# Patient Record
Sex: Female | Born: 1950 | Race: White | Hispanic: No | Smoking: Never smoker
Health system: Southern US, Community
[De-identification: ages and names within clinical notes are randomized; demographics above are authoritative.]

## PROBLEM LIST (undated history)

## (undated) DIAGNOSIS — R7303 Prediabetes: Secondary | ICD-10-CM

## (undated) DIAGNOSIS — G5601 Carpal tunnel syndrome, right upper limb: Secondary | ICD-10-CM

## (undated) DIAGNOSIS — R52 Pain, unspecified: Secondary | ICD-10-CM

## (undated) DIAGNOSIS — M545 Low back pain, unspecified: Secondary | ICD-10-CM

## (undated) DIAGNOSIS — I493 Ventricular premature depolarization: Secondary | ICD-10-CM

## (undated) DIAGNOSIS — I499 Cardiac arrhythmia, unspecified: Secondary | ICD-10-CM

## (undated) DIAGNOSIS — M4150 Other secondary scoliosis, site unspecified: Secondary | ICD-10-CM

## (undated) DIAGNOSIS — M1990 Unspecified osteoarthritis, unspecified site: Secondary | ICD-10-CM

## (undated) DIAGNOSIS — R29898 Other symptoms and signs involving the musculoskeletal system: Secondary | ICD-10-CM

## (undated) HISTORY — PX: OTHER SURGICAL HISTORY: SHX169

## (undated) HISTORY — DX: Other symptoms and signs involving the musculoskeletal system: R29.898

## (undated) HISTORY — DX: Low back pain, unspecified: M54.50

## (undated) HISTORY — DX: Pain, unspecified: R52

## (undated) HISTORY — DX: Carpal tunnel syndrome, right upper limb: G56.01

## (undated) HISTORY — DX: Other secondary scoliosis, site unspecified: M41.50

---

## 2016-03-28 DIAGNOSIS — R740 Nonspecific elevation of levels of transaminase and lactic acid dehydrogenase [LDH]: Secondary | ICD-10-CM | POA: Diagnosis not present

## 2016-03-28 DIAGNOSIS — M810 Age-related osteoporosis without current pathological fracture: Secondary | ICD-10-CM | POA: Diagnosis not present

## 2016-03-28 DIAGNOSIS — Z79899 Other long term (current) drug therapy: Secondary | ICD-10-CM | POA: Diagnosis not present

## 2016-03-28 DIAGNOSIS — Z23 Encounter for immunization: Secondary | ICD-10-CM | POA: Diagnosis not present

## 2016-03-28 DIAGNOSIS — G479 Sleep disorder, unspecified: Secondary | ICD-10-CM | POA: Diagnosis not present

## 2016-03-28 DIAGNOSIS — R7303 Prediabetes: Secondary | ICD-10-CM | POA: Diagnosis not present

## 2016-04-01 DIAGNOSIS — Z79899 Other long term (current) drug therapy: Secondary | ICD-10-CM | POA: Diagnosis not present

## 2016-04-01 DIAGNOSIS — M810 Age-related osteoporosis without current pathological fracture: Secondary | ICD-10-CM | POA: Diagnosis not present

## 2016-04-01 DIAGNOSIS — R7303 Prediabetes: Secondary | ICD-10-CM | POA: Diagnosis not present

## 2016-04-01 DIAGNOSIS — R740 Nonspecific elevation of levels of transaminase and lactic acid dehydrogenase [LDH]: Secondary | ICD-10-CM | POA: Diagnosis not present

## 2016-04-22 ENCOUNTER — Other Ambulatory Visit: Payer: Self-pay | Admitting: Gastroenterology

## 2016-04-22 DIAGNOSIS — R748 Abnormal levels of other serum enzymes: Secondary | ICD-10-CM | POA: Diagnosis not present

## 2016-04-22 DIAGNOSIS — R945 Abnormal results of liver function studies: Principal | ICD-10-CM

## 2016-04-22 DIAGNOSIS — Z1211 Encounter for screening for malignant neoplasm of colon: Secondary | ICD-10-CM | POA: Diagnosis not present

## 2016-04-22 DIAGNOSIS — R7989 Other specified abnormal findings of blood chemistry: Secondary | ICD-10-CM

## 2016-05-02 ENCOUNTER — Ambulatory Visit
Admission: RE | Admit: 2016-05-02 | Discharge: 2016-05-02 | Disposition: A | Discharge status: Home / Self Care | Payer: Medicare Other | Source: Ambulatory Visit | Attending: Gastroenterology | Admitting: Gastroenterology

## 2016-05-02 DIAGNOSIS — R945 Abnormal results of liver function studies: Principal | ICD-10-CM

## 2016-05-02 DIAGNOSIS — K8020 Calculus of gallbladder without cholecystitis without obstruction: Secondary | ICD-10-CM | POA: Diagnosis not present

## 2016-05-02 DIAGNOSIS — R7989 Other specified abnormal findings of blood chemistry: Secondary | ICD-10-CM

## 2016-05-02 IMAGING — US US ABDOMEN LIMITED
1 series · 14 of 25 positions shown · non-contrast
Comparison: None.

CLINICAL DATA: 65-year-old female with elevated transaminases

EXAM:
US ABDOMEN LIMITED - RIGHT UPPER QUADRANT

[Series 1: us abdomen limited · 0.20mm/px · 14 of 48 slices shown]
[im 1/48]
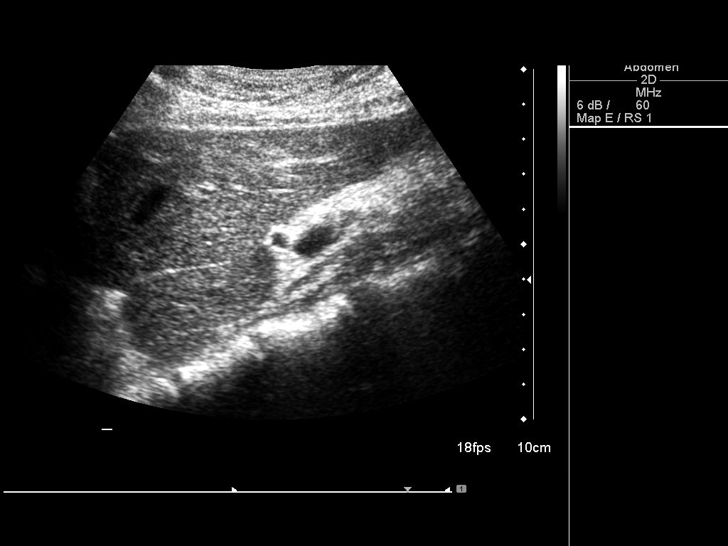
[im 4/48]
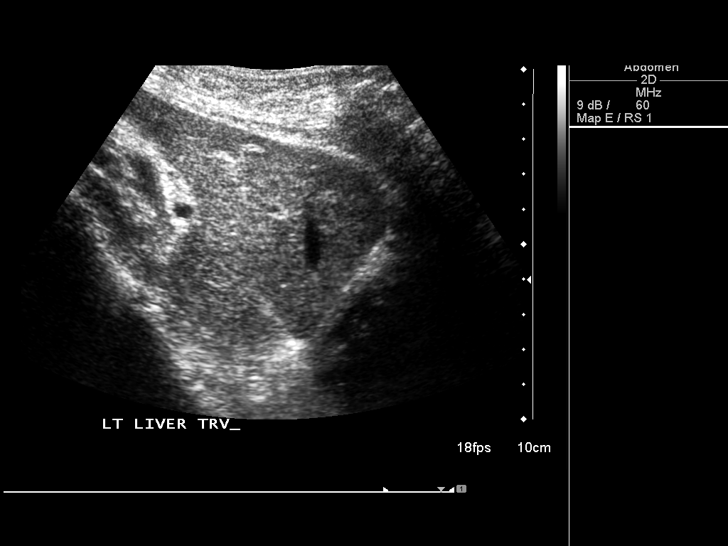
[im 8/48]
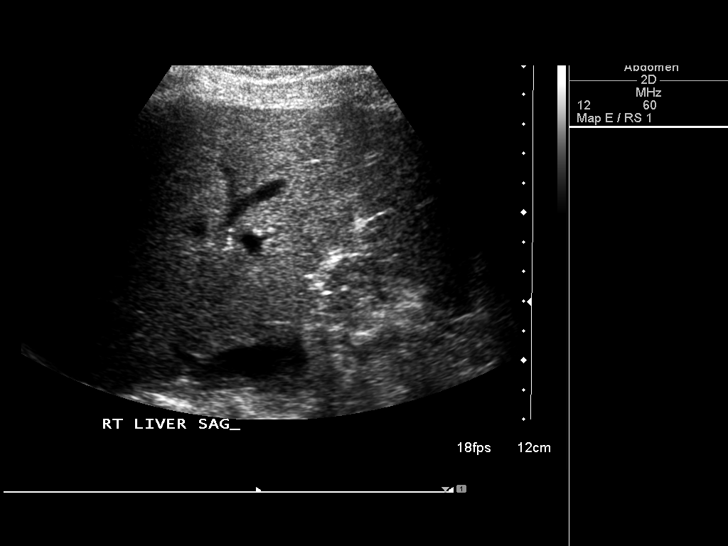
[im 12/48]
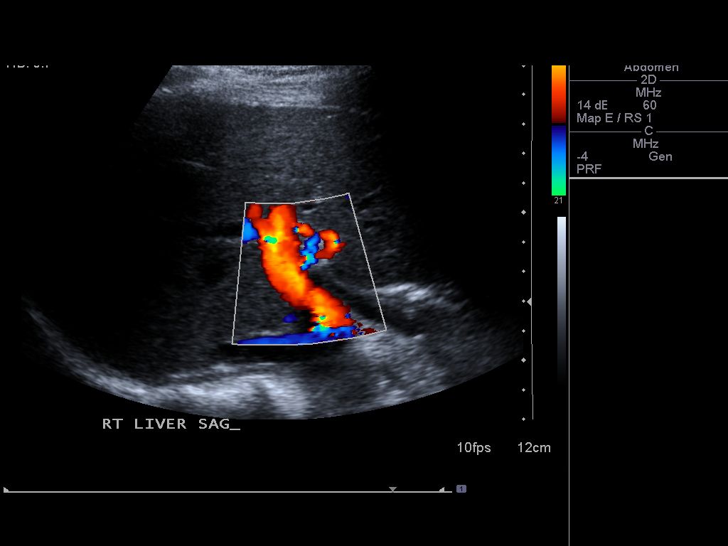
[im 16/48]
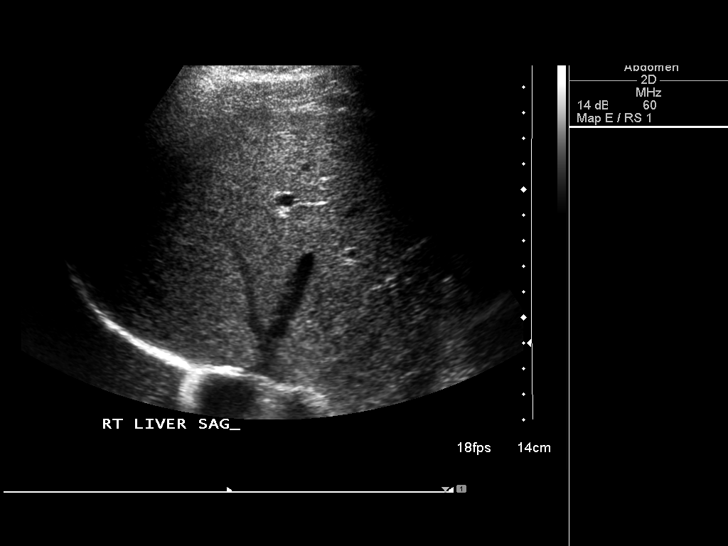
[im 18/48]
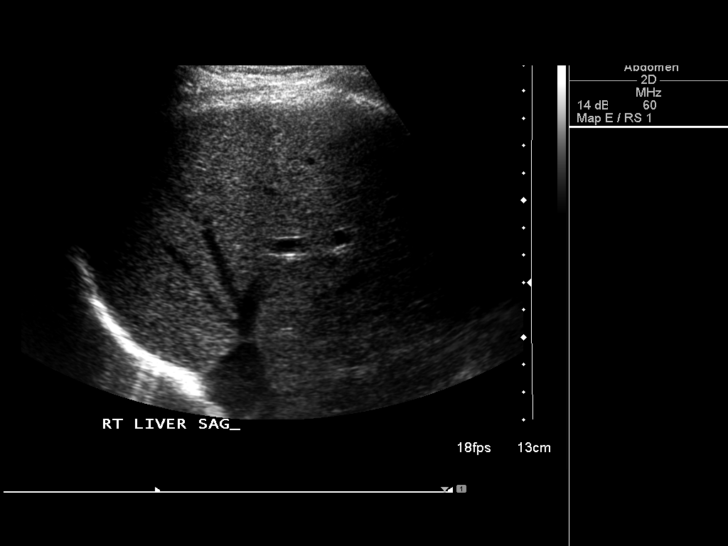
[im 22/48]
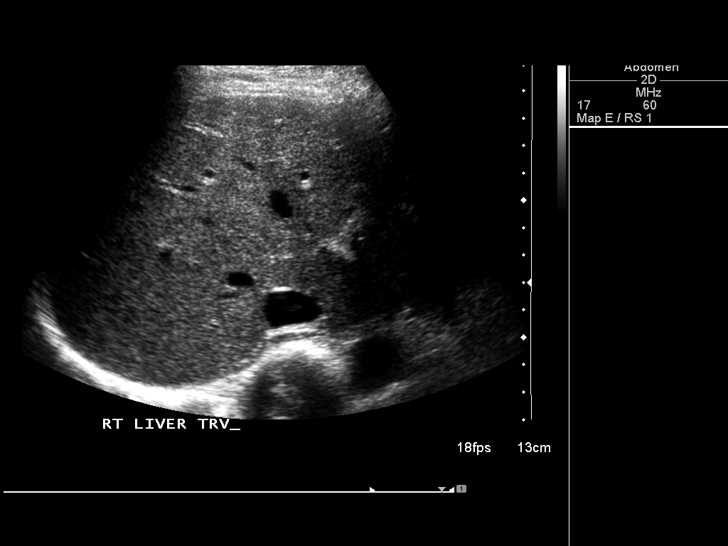
[im 26/48]
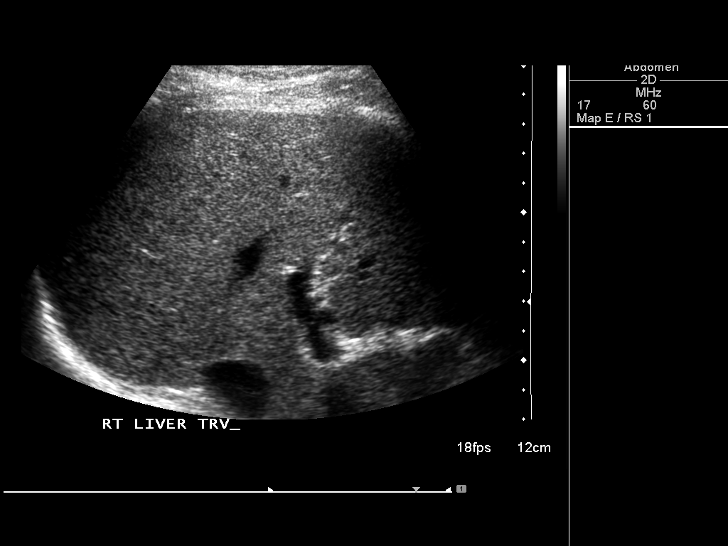
[im 30/48]
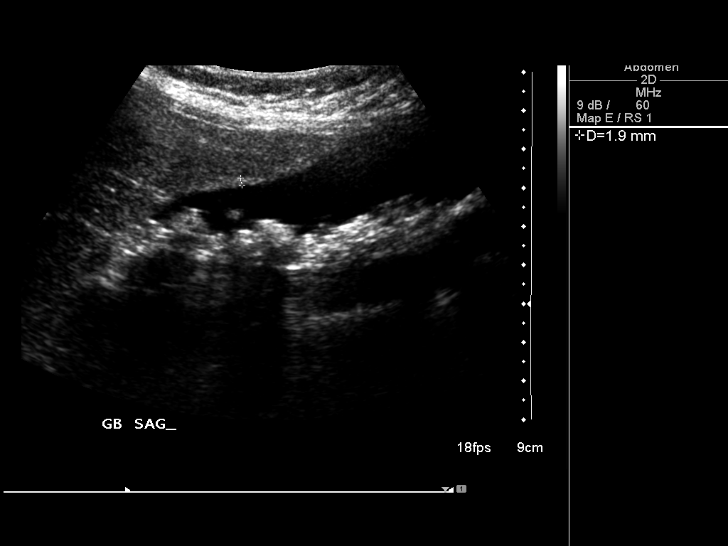
[im 32/48]
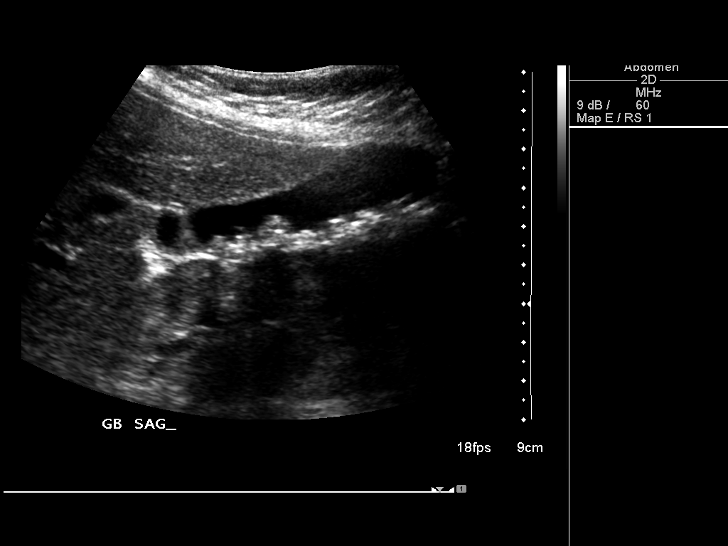
[im 36/48]
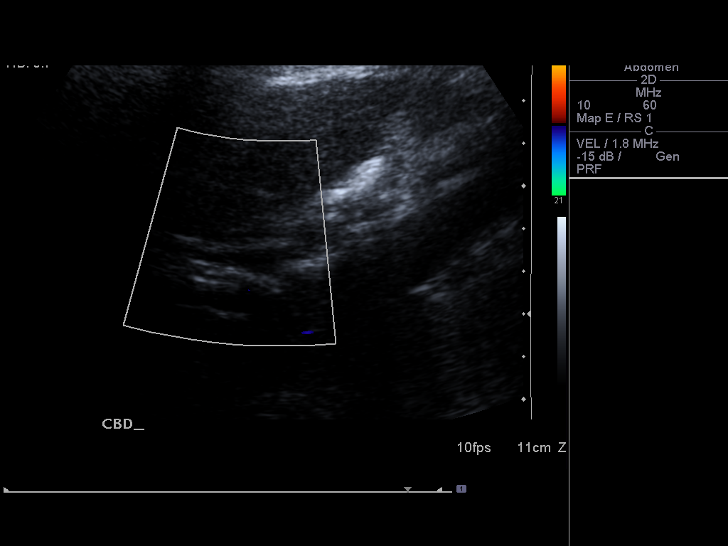
[im 40/48]
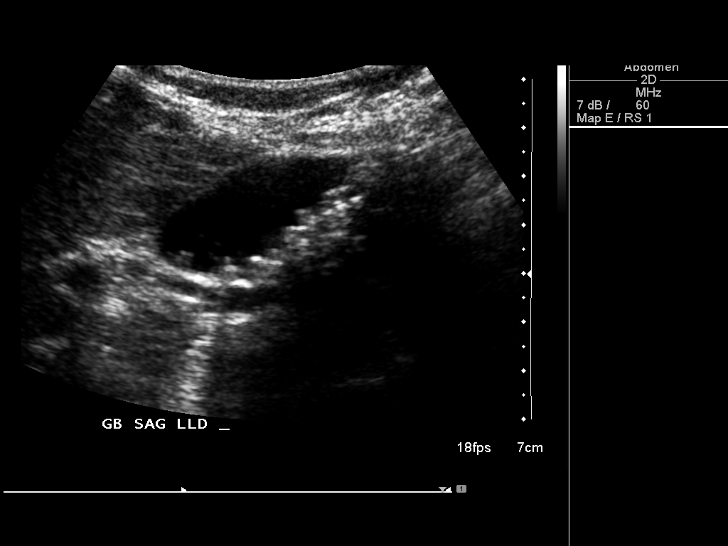
[im 44/48]
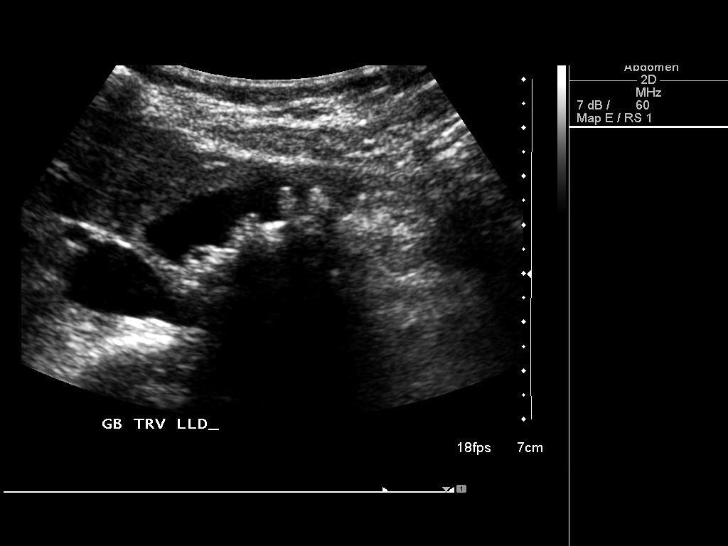
[im 48/48]
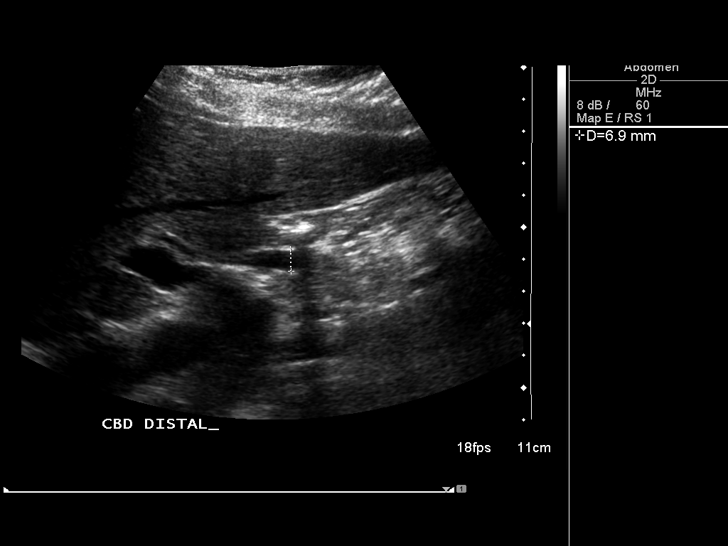

[14 of 25 positions shown; findings below may reference images not displayed]

FINDINGS: Gallbladder:

Multiple small echogenic foci inguinal layer within the gallbladder
lumen. Several demonstrate posterior acoustic shadowing consistent
with small stones. The largest stone measures up to 5 mm. The
gallbladder is not distended, there is no wall thickening or
pericholecystic fluid. Per the sonographer, the sonographic Murphy
sign was negative.

Common bile duct:

Diameter:  4-7 mm which is within normal limits for age.

Liver:

No focal lesion identified. Within normal limits in parenchymal
echogenicity. The main portal vein is patent with normal antegrade
flow.
IMPRESSION: 1. Cholelithiasis without secondary sonographic evidence of acute
cholecystitis.
2. Common bile duct remains within normal limits for age.
3. Unremarkable sonographic appearance of the liver.

## 2016-05-20 DIAGNOSIS — Z1211 Encounter for screening for malignant neoplasm of colon: Secondary | ICD-10-CM | POA: Diagnosis not present

## 2016-05-20 DIAGNOSIS — K8020 Calculus of gallbladder without cholecystitis without obstruction: Secondary | ICD-10-CM | POA: Diagnosis not present

## 2016-05-20 DIAGNOSIS — R740 Nonspecific elevation of levels of transaminase and lactic acid dehydrogenase [LDH]: Secondary | ICD-10-CM | POA: Diagnosis not present

## 2016-06-20 DIAGNOSIS — Z6823 Body mass index (BMI) 23.0-23.9, adult: Secondary | ICD-10-CM | POA: Diagnosis not present

## 2016-06-20 DIAGNOSIS — M818 Other osteoporosis without current pathological fracture: Secondary | ICD-10-CM | POA: Diagnosis not present

## 2016-06-20 DIAGNOSIS — M150 Primary generalized (osteo)arthritis: Secondary | ICD-10-CM | POA: Diagnosis not present

## 2016-06-24 DIAGNOSIS — M810 Age-related osteoporosis without current pathological fracture: Secondary | ICD-10-CM | POA: Diagnosis not present

## 2016-07-29 ENCOUNTER — Other Ambulatory Visit: Payer: Self-pay | Admitting: Obstetrics and Gynecology

## 2016-07-29 DIAGNOSIS — Z1231 Encounter for screening mammogram for malignant neoplasm of breast: Secondary | ICD-10-CM

## 2016-08-25 ENCOUNTER — Encounter: Payer: Self-pay | Admitting: Radiology

## 2016-08-25 ENCOUNTER — Ambulatory Visit
Admission: RE | Admit: 2016-08-25 | Discharge: 2016-08-25 | Disposition: A | Discharge status: Home / Self Care | Payer: Medicare Other | Source: Ambulatory Visit | Attending: Obstetrics and Gynecology | Admitting: Obstetrics and Gynecology

## 2016-08-25 DIAGNOSIS — Z1231 Encounter for screening mammogram for malignant neoplasm of breast: Secondary | ICD-10-CM | POA: Diagnosis not present

## 2016-09-06 ENCOUNTER — Other Ambulatory Visit (HOSPITAL_COMMUNITY)
Admission: RE | Admit: 2016-09-06 | Discharge: 2016-09-06 | Disposition: A | Discharge status: Home / Self Care | Payer: Medicare Other | Source: Ambulatory Visit | Attending: Obstetrics and Gynecology | Admitting: Obstetrics and Gynecology

## 2016-09-06 ENCOUNTER — Other Ambulatory Visit: Payer: Self-pay | Admitting: Obstetrics and Gynecology

## 2016-09-06 DIAGNOSIS — Z01419 Encounter for gynecological examination (general) (routine) without abnormal findings: Secondary | ICD-10-CM | POA: Diagnosis not present

## 2016-09-06 DIAGNOSIS — Z1151 Encounter for screening for human papillomavirus (HPV): Secondary | ICD-10-CM | POA: Insufficient documentation

## 2016-09-08 LAB — CYTOLOGY - PAP
DIAGNOSIS: NEGATIVE
HPV: NOT DETECTED

## 2016-10-20 DIAGNOSIS — J309 Allergic rhinitis, unspecified: Secondary | ICD-10-CM | POA: Diagnosis not present

## 2016-11-03 DIAGNOSIS — Z0000 Encounter for general adult medical examination without abnormal findings: Secondary | ICD-10-CM | POA: Diagnosis not present

## 2016-11-03 DIAGNOSIS — J309 Allergic rhinitis, unspecified: Secondary | ICD-10-CM | POA: Diagnosis not present

## 2016-11-03 DIAGNOSIS — I2510 Atherosclerotic heart disease of native coronary artery without angina pectoris: Secondary | ICD-10-CM | POA: Diagnosis not present

## 2016-11-03 DIAGNOSIS — R7303 Prediabetes: Secondary | ICD-10-CM | POA: Diagnosis not present

## 2016-11-03 DIAGNOSIS — M810 Age-related osteoporosis without current pathological fracture: Secondary | ICD-10-CM | POA: Diagnosis not present

## 2016-11-03 DIAGNOSIS — Z79899 Other long term (current) drug therapy: Secondary | ICD-10-CM | POA: Diagnosis not present

## 2016-11-03 DIAGNOSIS — R350 Frequency of micturition: Secondary | ICD-10-CM | POA: Diagnosis not present

## 2016-11-03 DIAGNOSIS — G4700 Insomnia, unspecified: Secondary | ICD-10-CM | POA: Diagnosis not present

## 2016-11-03 DIAGNOSIS — M519 Unspecified thoracic, thoracolumbar and lumbosacral intervertebral disc disorder: Secondary | ICD-10-CM | POA: Diagnosis not present

## 2016-11-07 DIAGNOSIS — H2513 Age-related nuclear cataract, bilateral: Secondary | ICD-10-CM | POA: Diagnosis not present

## 2016-11-07 DIAGNOSIS — H52203 Unspecified astigmatism, bilateral: Secondary | ICD-10-CM | POA: Diagnosis not present

## 2016-11-22 DIAGNOSIS — L814 Other melanin hyperpigmentation: Secondary | ICD-10-CM | POA: Diagnosis not present

## 2016-11-22 DIAGNOSIS — D229 Melanocytic nevi, unspecified: Secondary | ICD-10-CM | POA: Diagnosis not present

## 2016-11-22 DIAGNOSIS — L821 Other seborrheic keratosis: Secondary | ICD-10-CM | POA: Diagnosis not present

## 2016-12-19 DIAGNOSIS — M7989 Other specified soft tissue disorders: Secondary | ICD-10-CM | POA: Diagnosis not present

## 2016-12-23 DIAGNOSIS — M810 Age-related osteoporosis without current pathological fracture: Secondary | ICD-10-CM | POA: Diagnosis not present

## 2017-02-15 DIAGNOSIS — Z1211 Encounter for screening for malignant neoplasm of colon: Secondary | ICD-10-CM | POA: Diagnosis not present

## 2017-04-14 DIAGNOSIS — Z23 Encounter for immunization: Secondary | ICD-10-CM | POA: Diagnosis not present

## 2017-06-02 DIAGNOSIS — Q6100 Congenital renal cyst, unspecified: Secondary | ICD-10-CM | POA: Diagnosis not present

## 2017-06-02 DIAGNOSIS — R7303 Prediabetes: Secondary | ICD-10-CM | POA: Diagnosis not present

## 2017-06-02 DIAGNOSIS — N200 Calculus of kidney: Secondary | ICD-10-CM | POA: Diagnosis not present

## 2017-06-02 DIAGNOSIS — J309 Allergic rhinitis, unspecified: Secondary | ICD-10-CM | POA: Diagnosis not present

## 2017-06-02 DIAGNOSIS — R252 Cramp and spasm: Secondary | ICD-10-CM | POA: Diagnosis not present

## 2017-06-02 DIAGNOSIS — E7800 Pure hypercholesterolemia, unspecified: Secondary | ICD-10-CM | POA: Diagnosis not present

## 2017-06-28 DIAGNOSIS — M1712 Unilateral primary osteoarthritis, left knee: Secondary | ICD-10-CM | POA: Diagnosis not present

## 2017-06-28 DIAGNOSIS — M810 Age-related osteoporosis without current pathological fracture: Secondary | ICD-10-CM | POA: Diagnosis not present

## 2017-07-26 ENCOUNTER — Ambulatory Visit (INDEPENDENT_AMBULATORY_CARE_PROVIDER_SITE_OTHER): Payer: Medicare Other

## 2017-07-26 ENCOUNTER — Ambulatory Visit (INDEPENDENT_AMBULATORY_CARE_PROVIDER_SITE_OTHER): Payer: Medicare Other | Admitting: Podiatry

## 2017-07-26 ENCOUNTER — Other Ambulatory Visit: Payer: Self-pay | Admitting: Podiatry

## 2017-07-26 DIAGNOSIS — M79672 Pain in left foot: Secondary | ICD-10-CM

## 2017-07-26 DIAGNOSIS — M779 Enthesopathy, unspecified: Secondary | ICD-10-CM | POA: Diagnosis not present

## 2017-07-26 DIAGNOSIS — M7750 Other enthesopathy of unspecified foot: Secondary | ICD-10-CM

## 2017-07-26 DIAGNOSIS — M79671 Pain in right foot: Secondary | ICD-10-CM

## 2017-07-26 DIAGNOSIS — M79676 Pain in unspecified toe(s): Secondary | ICD-10-CM

## 2017-07-26 NOTE — Progress Notes (Signed)
   Subjective:    Patient ID: Carla Kelley, female    DOB: 1950-09-27, 67 y.o.   MRN: [id number]  HPI    Review of Systems  All other systems reviewed and are negative.      Objective:   Physical Exam        Assessment & Plan:

## 2017-07-29 NOTE — Progress Notes (Signed)
Subjective:   Patient ID: Carla Kelley, female   DOB: 67 y.o.   MRN: [id number]   HPI Patient presents stating that she has a leg bit shorter than the other and she does get foot pain that is somewhat chronic and she takes diclofenac.  Patient does not smoke and likes to be active and this is affecting it and she has developed hip pain   Review of Systems  All other systems reviewed and are negative.       Objective:  Physical Exam  Constitutional: She appears well-developed and well-nourished.  Cardiovascular: Intact distal pulses.  Pulmonary/Chest: Effort normal.  Musculoskeletal: Normal range of motion.  Neurological: She is alert.  Skin: Skin is warm.  Nursing note and vitals reviewed.   Neurovascular status found to be intact with muscle strength adequate range of motion within normal limits.  Patient is noted to have moderate discomfort in the foot structure bilateral plantar with no acute area of discomfort with significant limb length discrepancy with the left leg being approximately 3/8 of an inch shorter than the right leg.  Patient was noted to have good digital perfusion and is well oriented x3     Assessment:  Inflammatory condition in conjunction with limb length discrepancy and heel pain     Plan:  H&P x-rays reviewed and condition discussed.  At this point I have recommended a orthotic and I consulted with our ped orthotist and we are crafting an orthotic which we think will give her relief of symptoms.  Patient was scanned today and we will get the orthotics to her in the next several weeks  X-rays indicate that there is moderate depression of the arch with no indications of stress fracture or advanced arthritis

## 2017-08-11 ENCOUNTER — Other Ambulatory Visit: Payer: Self-pay | Admitting: Family Medicine

## 2017-08-11 DIAGNOSIS — Z1231 Encounter for screening mammogram for malignant neoplasm of breast: Secondary | ICD-10-CM

## 2017-08-16 ENCOUNTER — Encounter: Payer: Self-pay | Admitting: Podiatry

## 2017-08-16 ENCOUNTER — Ambulatory Visit (INDEPENDENT_AMBULATORY_CARE_PROVIDER_SITE_OTHER): Payer: Medicare Other | Admitting: Podiatry

## 2017-08-16 DIAGNOSIS — M779 Enthesopathy, unspecified: Secondary | ICD-10-CM | POA: Diagnosis not present

## 2017-08-16 DIAGNOSIS — M79672 Pain in left foot: Secondary | ICD-10-CM | POA: Diagnosis not present

## 2017-08-16 DIAGNOSIS — M79671 Pain in right foot: Secondary | ICD-10-CM | POA: Diagnosis not present

## 2017-08-17 NOTE — Progress Notes (Signed)
Subjective:   Patient ID: Carla Kelley, female   DOB: 67 y.o.   MRN: [id number]   HPI Patient presents stating she started to develop more discomfort at the base of her digits left over right when she walks and this is been gradually getting worse over the last few weeks   ROS      Objective:  Physical Exam  Neurovascular status intact with inflammatory capsulitis noted of the lesser MPJs left second and third metatarsals     Assessment:  Probability for inflammatory capsulitis lesser MPJs left     Plan:  Reviewed capsulitis and treatment options.  She is going to change her shoe gear at the current time and I did go ahead and I placed her in a metatarsal pad to disperse weight off the metatarsals and discussed possible injections if symptoms persist.  She will be watched and seen back and is due to get her orthotics in the next few weeks

## 2017-08-23 ENCOUNTER — Ambulatory Visit: Payer: Medicare Other | Admitting: Orthotics

## 2017-08-23 ENCOUNTER — Ambulatory Visit: Payer: Medicare Other | Admitting: Podiatry

## 2017-08-23 DIAGNOSIS — M779 Enthesopathy, unspecified: Secondary | ICD-10-CM

## 2017-08-23 DIAGNOSIS — M79671 Pain in right foot: Secondary | ICD-10-CM

## 2017-08-23 DIAGNOSIS — M79672 Pain in left foot: Principal | ICD-10-CM

## 2017-08-23 NOTE — Progress Notes (Signed)
Patient came in today to pick up custom made foot orthotics.  The goals were accomplished and the patient reported no dissatisfaction with said orthotics.  Patient was advised of breakin period and how to report any issues.  However, patient may bring f/o for modifications:  1/4" extra lift Left, met pad b/l, and 2/3 offload Left.

## 2017-09-04 ENCOUNTER — Ambulatory Visit: Payer: Medicare Other

## 2017-09-07 DIAGNOSIS — R7303 Prediabetes: Secondary | ICD-10-CM | POA: Diagnosis not present

## 2017-09-07 DIAGNOSIS — R42 Dizziness and giddiness: Secondary | ICD-10-CM | POA: Diagnosis not present

## 2017-09-07 DIAGNOSIS — J309 Allergic rhinitis, unspecified: Secondary | ICD-10-CM | POA: Diagnosis not present

## 2017-09-07 DIAGNOSIS — E7800 Pure hypercholesterolemia, unspecified: Secondary | ICD-10-CM | POA: Diagnosis not present

## 2017-09-11 ENCOUNTER — Ambulatory Visit
Admission: RE | Admit: 2017-09-11 | Discharge: 2017-09-11 | Disposition: A | Discharge status: Home / Self Care | Payer: Medicare Other | Source: Ambulatory Visit | Attending: Family Medicine | Admitting: Family Medicine

## 2017-09-11 DIAGNOSIS — Z1231 Encounter for screening mammogram for malignant neoplasm of breast: Secondary | ICD-10-CM

## 2017-09-21 ENCOUNTER — Ambulatory Visit: Payer: Medicare Other | Admitting: Orthotics

## 2017-09-21 DIAGNOSIS — M779 Enthesopathy, unspecified: Secondary | ICD-10-CM

## 2017-09-21 DIAGNOSIS — M79672 Pain in left foot: Principal | ICD-10-CM

## 2017-09-21 DIAGNOSIS — M79671 Pain in right foot: Secondary | ICD-10-CM

## 2017-09-21 NOTE — Progress Notes (Signed)
Added 1/8 inch lift to current foot orthoic LEFT

## 2017-09-28 DIAGNOSIS — Z6824 Body mass index (BMI) 24.0-24.9, adult: Secondary | ICD-10-CM | POA: Diagnosis not present

## 2017-09-28 DIAGNOSIS — N390 Urinary tract infection, site not specified: Secondary | ICD-10-CM | POA: Diagnosis not present

## 2017-09-28 DIAGNOSIS — Z01419 Encounter for gynecological examination (general) (routine) without abnormal findings: Secondary | ICD-10-CM | POA: Diagnosis not present

## 2017-10-03 ENCOUNTER — Other Ambulatory Visit: Payer: Self-pay | Admitting: Obstetrics and Gynecology

## 2017-10-03 DIAGNOSIS — N6320 Unspecified lump in the left breast, unspecified quadrant: Secondary | ICD-10-CM

## 2017-10-04 ENCOUNTER — Ambulatory Visit: Payer: Medicare Other | Admitting: Family Medicine

## 2017-10-05 ENCOUNTER — Other Ambulatory Visit: Payer: Medicare Other

## 2017-10-06 ENCOUNTER — Ambulatory Visit
Admission: RE | Admit: 2017-10-06 | Discharge: 2017-10-06 | Disposition: A | Discharge status: Home / Self Care | Payer: Medicare Other | Source: Ambulatory Visit | Attending: Obstetrics and Gynecology | Admitting: Obstetrics and Gynecology

## 2017-10-06 DIAGNOSIS — N6489 Other specified disorders of breast: Secondary | ICD-10-CM | POA: Diagnosis not present

## 2017-10-06 DIAGNOSIS — N6320 Unspecified lump in the left breast, unspecified quadrant: Secondary | ICD-10-CM

## 2017-10-06 DIAGNOSIS — R922 Inconclusive mammogram: Secondary | ICD-10-CM | POA: Diagnosis not present

## 2017-10-11 DIAGNOSIS — N959 Unspecified menopausal and perimenopausal disorder: Secondary | ICD-10-CM | POA: Diagnosis not present

## 2017-10-11 DIAGNOSIS — M8588 Other specified disorders of bone density and structure, other site: Secondary | ICD-10-CM | POA: Diagnosis not present

## 2017-11-01 DIAGNOSIS — Z01812 Encounter for preprocedural laboratory examination: Secondary | ICD-10-CM | POA: Diagnosis not present

## 2017-11-01 DIAGNOSIS — M818 Other osteoporosis without current pathological fracture: Secondary | ICD-10-CM | POA: Diagnosis not present

## 2017-12-29 DIAGNOSIS — M810 Age-related osteoporosis without current pathological fracture: Secondary | ICD-10-CM | POA: Diagnosis not present

## 2018-01-05 DIAGNOSIS — N3281 Overactive bladder: Secondary | ICD-10-CM | POA: Diagnosis not present

## 2018-02-02 DIAGNOSIS — R7303 Prediabetes: Secondary | ICD-10-CM | POA: Diagnosis not present

## 2018-02-02 DIAGNOSIS — Z1211 Encounter for screening for malignant neoplasm of colon: Secondary | ICD-10-CM | POA: Diagnosis not present

## 2018-02-02 DIAGNOSIS — Z0000 Encounter for general adult medical examination without abnormal findings: Secondary | ICD-10-CM | POA: Diagnosis not present

## 2018-02-02 DIAGNOSIS — M519 Unspecified thoracic, thoracolumbar and lumbosacral intervertebral disc disorder: Secondary | ICD-10-CM | POA: Diagnosis not present

## 2018-02-02 DIAGNOSIS — J309 Allergic rhinitis, unspecified: Secondary | ICD-10-CM | POA: Diagnosis not present

## 2018-02-02 DIAGNOSIS — I2510 Atherosclerotic heart disease of native coronary artery without angina pectoris: Secondary | ICD-10-CM | POA: Diagnosis not present

## 2018-02-02 DIAGNOSIS — Z79899 Other long term (current) drug therapy: Secondary | ICD-10-CM | POA: Diagnosis not present

## 2018-02-02 DIAGNOSIS — K8020 Calculus of gallbladder without cholecystitis without obstruction: Secondary | ICD-10-CM | POA: Diagnosis not present

## 2018-02-02 DIAGNOSIS — M810 Age-related osteoporosis without current pathological fracture: Secondary | ICD-10-CM | POA: Diagnosis not present

## 2018-02-02 DIAGNOSIS — G4700 Insomnia, unspecified: Secondary | ICD-10-CM | POA: Diagnosis not present

## 2018-02-02 DIAGNOSIS — I700 Atherosclerosis of aorta: Secondary | ICD-10-CM | POA: Diagnosis not present

## 2018-03-23 DIAGNOSIS — Z23 Encounter for immunization: Secondary | ICD-10-CM | POA: Diagnosis not present

## 2018-04-09 DIAGNOSIS — M1712 Unilateral primary osteoarthritis, left knee: Secondary | ICD-10-CM | POA: Diagnosis not present

## 2018-04-09 DIAGNOSIS — M25511 Pain in right shoulder: Secondary | ICD-10-CM | POA: Diagnosis not present

## 2018-04-23 DIAGNOSIS — A084 Viral intestinal infection, unspecified: Secondary | ICD-10-CM | POA: Diagnosis not present

## 2018-05-01 DIAGNOSIS — R197 Diarrhea, unspecified: Secondary | ICD-10-CM | POA: Diagnosis not present

## 2018-05-10 DIAGNOSIS — R197 Diarrhea, unspecified: Secondary | ICD-10-CM | POA: Diagnosis not present

## 2018-05-28 ENCOUNTER — Other Ambulatory Visit: Payer: Self-pay | Admitting: Dermatology

## 2018-05-28 DIAGNOSIS — L820 Inflamed seborrheic keratosis: Secondary | ICD-10-CM | POA: Diagnosis not present

## 2018-05-28 DIAGNOSIS — D485 Neoplasm of uncertain behavior of skin: Secondary | ICD-10-CM | POA: Diagnosis not present

## 2018-05-28 DIAGNOSIS — L821 Other seborrheic keratosis: Secondary | ICD-10-CM | POA: Diagnosis not present

## 2018-05-28 DIAGNOSIS — D229 Melanocytic nevi, unspecified: Secondary | ICD-10-CM | POA: Diagnosis not present

## 2018-06-08 DIAGNOSIS — R197 Diarrhea, unspecified: Secondary | ICD-10-CM | POA: Diagnosis not present

## 2018-06-15 DIAGNOSIS — R197 Diarrhea, unspecified: Secondary | ICD-10-CM | POA: Diagnosis not present

## 2018-06-18 DIAGNOSIS — Z1211 Encounter for screening for malignant neoplasm of colon: Secondary | ICD-10-CM | POA: Diagnosis not present

## 2018-06-18 DIAGNOSIS — R197 Diarrhea, unspecified: Secondary | ICD-10-CM | POA: Diagnosis not present

## 2018-07-03 DIAGNOSIS — M810 Age-related osteoporosis without current pathological fracture: Secondary | ICD-10-CM | POA: Diagnosis not present

## 2018-07-18 DIAGNOSIS — R197 Diarrhea, unspecified: Secondary | ICD-10-CM | POA: Diagnosis not present

## 2018-07-18 DIAGNOSIS — K52831 Collagenous colitis: Secondary | ICD-10-CM | POA: Diagnosis not present

## 2018-07-18 DIAGNOSIS — R634 Abnormal weight loss: Secondary | ICD-10-CM | POA: Diagnosis not present

## 2018-07-30 DIAGNOSIS — R69 Illness, unspecified: Secondary | ICD-10-CM | POA: Diagnosis not present

## 2018-08-15 DIAGNOSIS — K52831 Collagenous colitis: Secondary | ICD-10-CM | POA: Diagnosis not present

## 2018-08-15 DIAGNOSIS — R197 Diarrhea, unspecified: Secondary | ICD-10-CM | POA: Diagnosis not present

## 2018-08-21 ENCOUNTER — Other Ambulatory Visit: Payer: Self-pay | Admitting: Obstetrics and Gynecology

## 2018-08-21 DIAGNOSIS — Z1231 Encounter for screening mammogram for malignant neoplasm of breast: Secondary | ICD-10-CM

## 2018-09-19 ENCOUNTER — Ambulatory Visit
Admission: RE | Admit: 2018-09-19 | Discharge: 2018-09-19 | Disposition: A | Discharge status: Home / Self Care | Payer: Medicare HMO | Source: Ambulatory Visit | Attending: Obstetrics and Gynecology | Admitting: Obstetrics and Gynecology

## 2018-09-19 ENCOUNTER — Other Ambulatory Visit: Payer: Self-pay

## 2018-09-19 DIAGNOSIS — Z1231 Encounter for screening mammogram for malignant neoplasm of breast: Secondary | ICD-10-CM

## 2018-09-19 IMAGING — MG DIGITAL SCREENING BILATERAL MAMMOGRAM WITH TOMO AND CAD
8 series · 9 of 24 positions shown · non-contrast
Comparison: Previous exam(s).

CLINICAL DATA: Screening.

EXAM:
DIGITAL SCREENING BILATERAL MAMMOGRAM WITH TOMO AND CAD

[R MLO synth-2D]
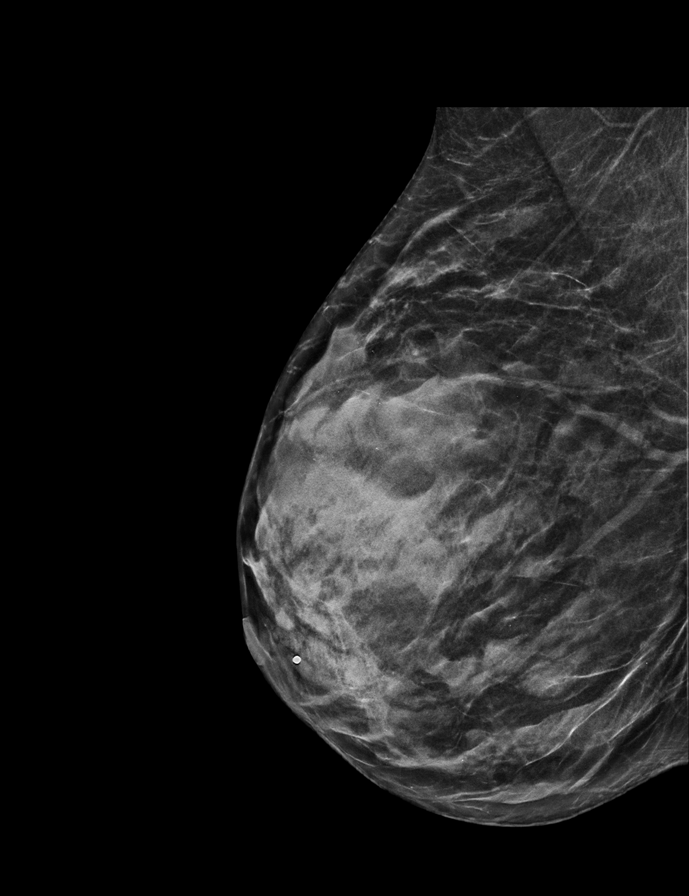

[R CC synth-2D]
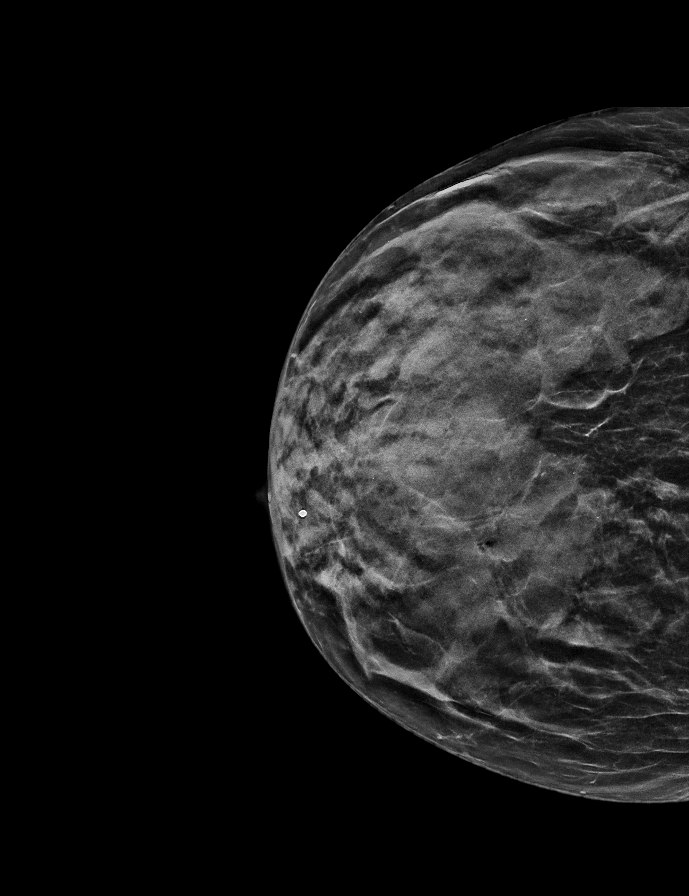

[L CC synth-2D]
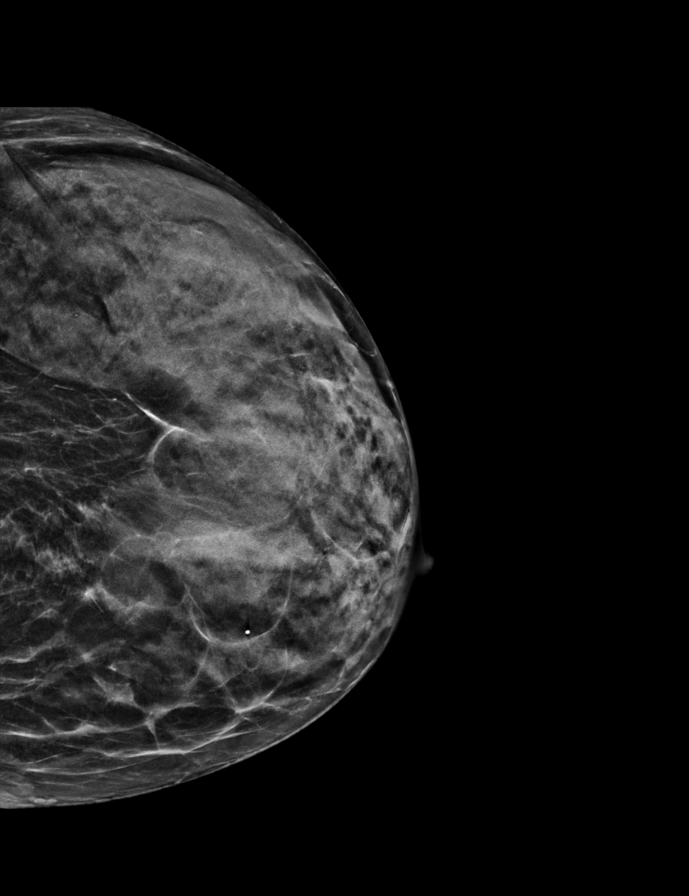

[L MLO synth-2D]
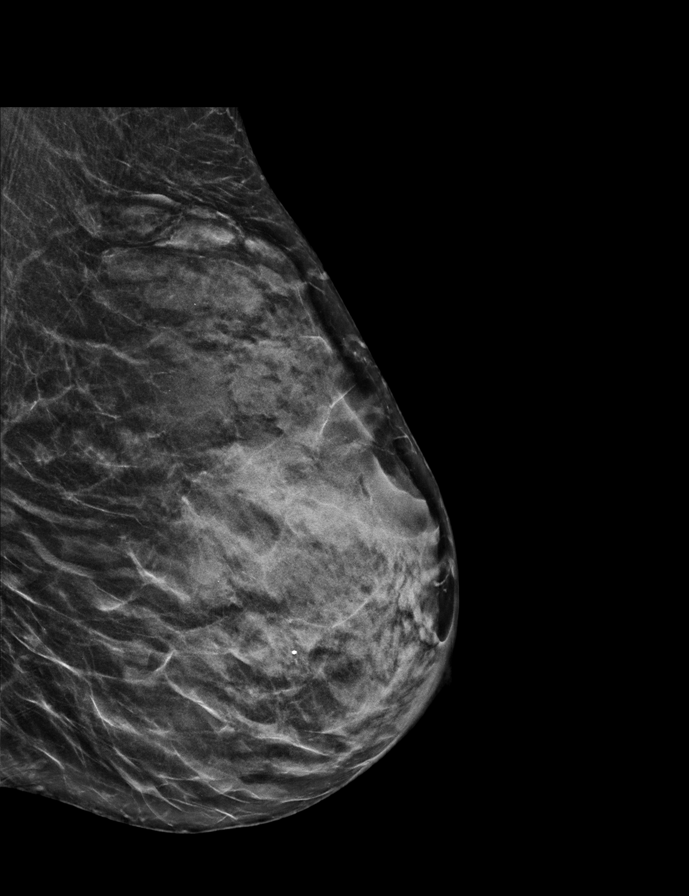

[L CC tomo · 2 of 43 frames shown]
[frame 14/43]
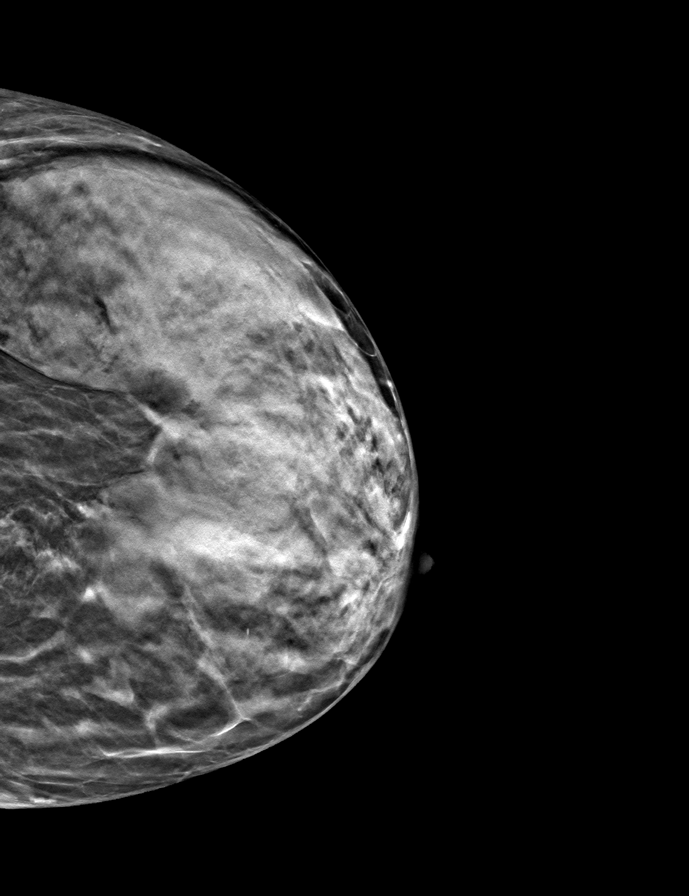
[frame 22/43]
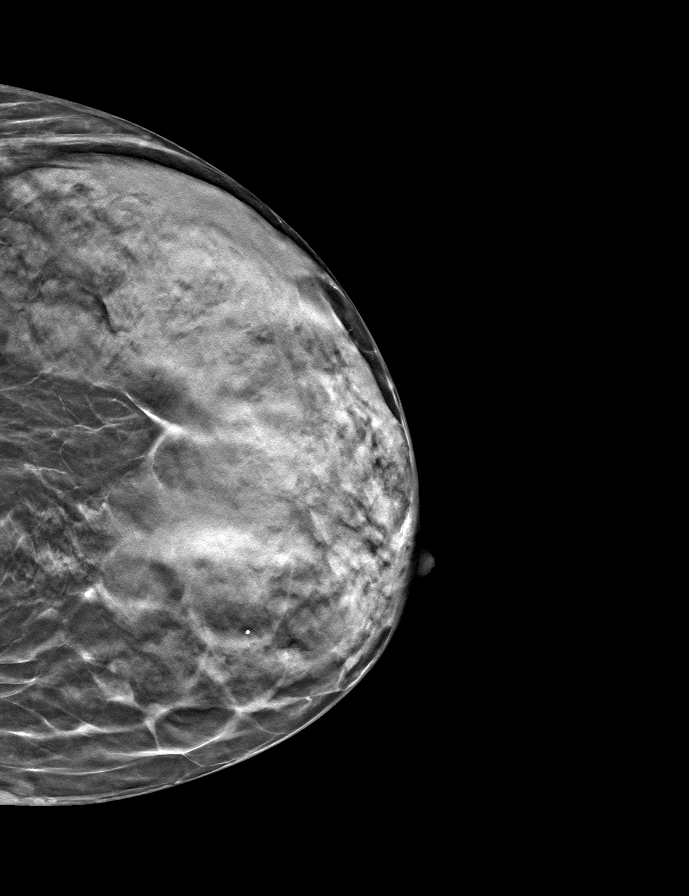

[R MLO tomo · tomo slice 23/46.0]
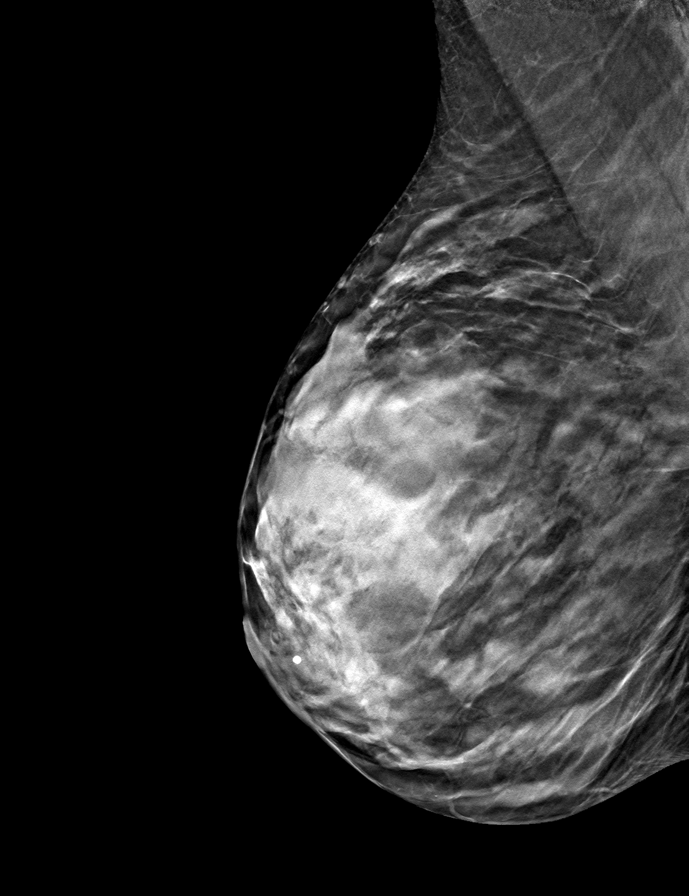

[R CC tomo · tomo slice 21/40.0]
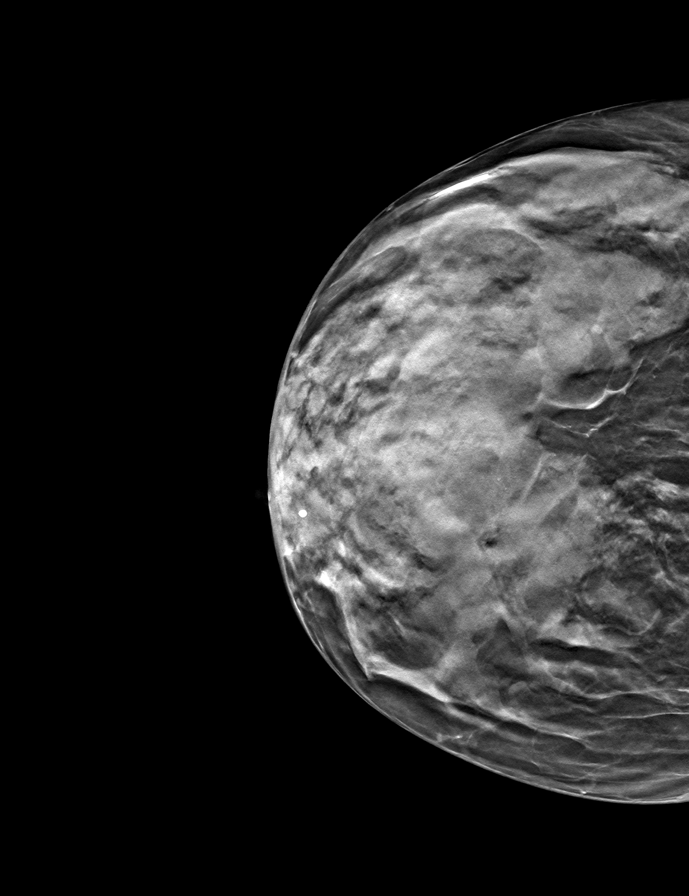

[L MLO tomo · tomo slice 24/47.0]
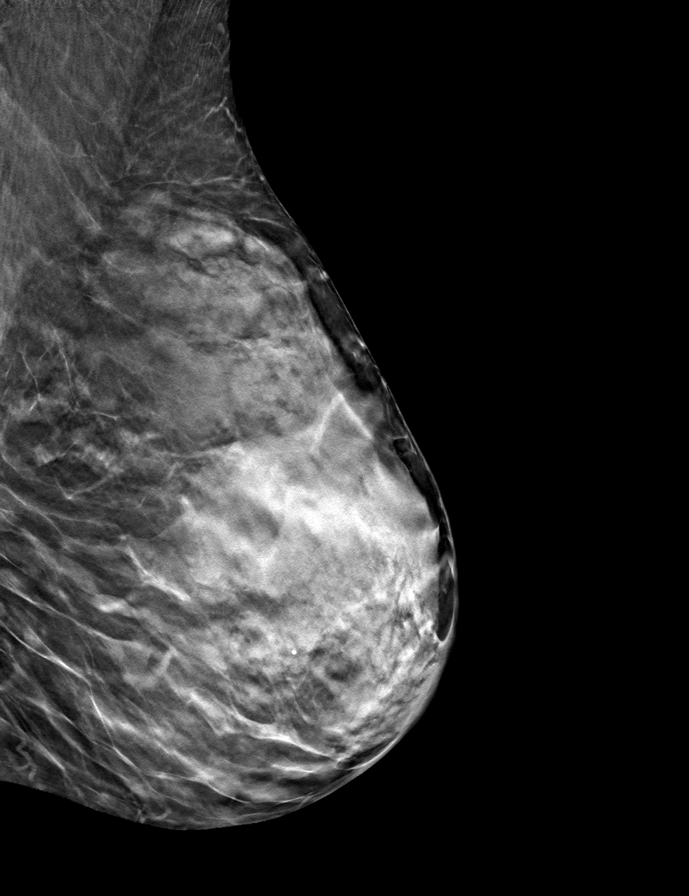

[9 of 24 positions shown; findings below may reference images not displayed]

ACR Breast Density Category d: The breast tissue is extremely dense,
which lowers the sensitivity of mammography
FINDINGS: There are no findings suspicious for malignancy. Images were
processed with CAD.
IMPRESSION: No mammographic evidence of malignancy. A result letter of this
screening mammogram will be mailed directly to the patient.

RECOMMENDATION:
Screening mammogram in one year. (Code:[5I])

BI-RADS CATEGORY  1: Negative.

## 2018-12-17 DIAGNOSIS — N952 Postmenopausal atrophic vaginitis: Secondary | ICD-10-CM | POA: Diagnosis not present

## 2018-12-17 DIAGNOSIS — E7800 Pure hypercholesterolemia, unspecified: Secondary | ICD-10-CM | POA: Insufficient documentation

## 2018-12-17 DIAGNOSIS — N3281 Overactive bladder: Secondary | ICD-10-CM | POA: Diagnosis not present

## 2018-12-17 DIAGNOSIS — M810 Age-related osteoporosis without current pathological fracture: Secondary | ICD-10-CM | POA: Diagnosis not present

## 2018-12-17 DIAGNOSIS — Z01419 Encounter for gynecological examination (general) (routine) without abnormal findings: Secondary | ICD-10-CM | POA: Diagnosis not present

## 2018-12-19 DIAGNOSIS — K52831 Collagenous colitis: Secondary | ICD-10-CM | POA: Diagnosis not present

## 2018-12-19 DIAGNOSIS — R634 Abnormal weight loss: Secondary | ICD-10-CM | POA: Diagnosis not present

## 2018-12-26 DIAGNOSIS — R69 Illness, unspecified: Secondary | ICD-10-CM | POA: Diagnosis not present

## 2018-12-31 DIAGNOSIS — M150 Primary generalized (osteo)arthritis: Secondary | ICD-10-CM | POA: Diagnosis not present

## 2018-12-31 DIAGNOSIS — M810 Age-related osteoporosis without current pathological fracture: Secondary | ICD-10-CM | POA: Diagnosis not present

## 2019-01-02 DIAGNOSIS — M810 Age-related osteoporosis without current pathological fracture: Secondary | ICD-10-CM | POA: Diagnosis not present

## 2019-02-19 DIAGNOSIS — R079 Chest pain, unspecified: Secondary | ICD-10-CM | POA: Insufficient documentation

## 2019-03-18 DIAGNOSIS — N644 Mastodynia: Secondary | ICD-10-CM | POA: Diagnosis not present

## 2019-03-19 ENCOUNTER — Other Ambulatory Visit: Payer: Self-pay | Admitting: Obstetrics and Gynecology

## 2019-03-19 DIAGNOSIS — N644 Mastodynia: Secondary | ICD-10-CM

## 2019-03-19 DIAGNOSIS — N6320 Unspecified lump in the left breast, unspecified quadrant: Secondary | ICD-10-CM

## 2019-03-26 ENCOUNTER — Other Ambulatory Visit: Payer: Self-pay

## 2019-03-26 ENCOUNTER — Ambulatory Visit
Admission: RE | Admit: 2019-03-26 | Discharge: 2019-03-26 | Disposition: A | Discharge status: Home / Self Care | Payer: Medicare HMO | Source: Ambulatory Visit | Attending: Obstetrics and Gynecology | Admitting: Obstetrics and Gynecology

## 2019-03-26 DIAGNOSIS — R922 Inconclusive mammogram: Secondary | ICD-10-CM | POA: Diagnosis not present

## 2019-03-26 DIAGNOSIS — N644 Mastodynia: Secondary | ICD-10-CM

## 2019-03-26 DIAGNOSIS — N6320 Unspecified lump in the left breast, unspecified quadrant: Secondary | ICD-10-CM

## 2019-03-26 IMAGING — US US BREAST*L* LIMITED INC AXILLA
1 series · 6 of 6 positions shown · non-contrast
Comparison: Previous exam(s).

CLINICAL DATA: 68-year-old female presenting for evaluation of
diffuse pain in the upper-outer left breast as well as a mobile mass
in the upper-outer quadrant of the left breast identified on
clinical breast exam. The patient had a fall 5 weeks ago while
walking her dog, and she hit the left breast.

EXAM:
DIGITAL DIAGNOSTIC LEFT MAMMOGRAM WITH CAD AND TOMO
ULTRASOUND LEFT BREAST

[Series 1: us breast*left* limited inc axilla · 0.06mm/px · 6 of 6 slices shown]
[im 1/6]
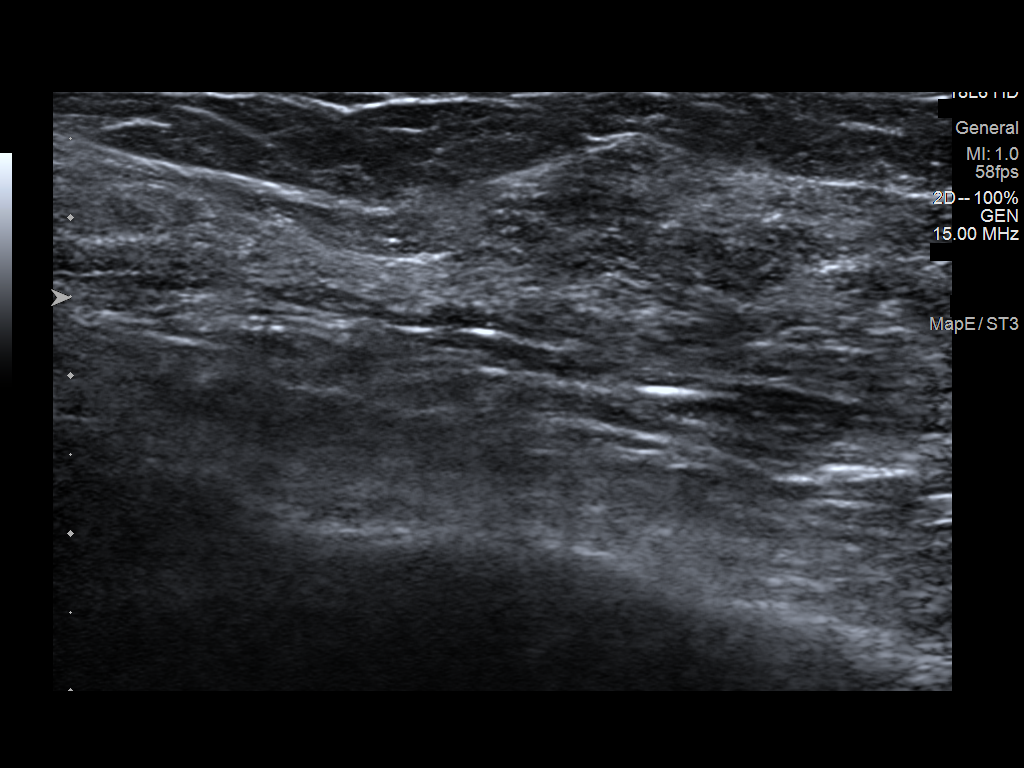
[im 2/6]
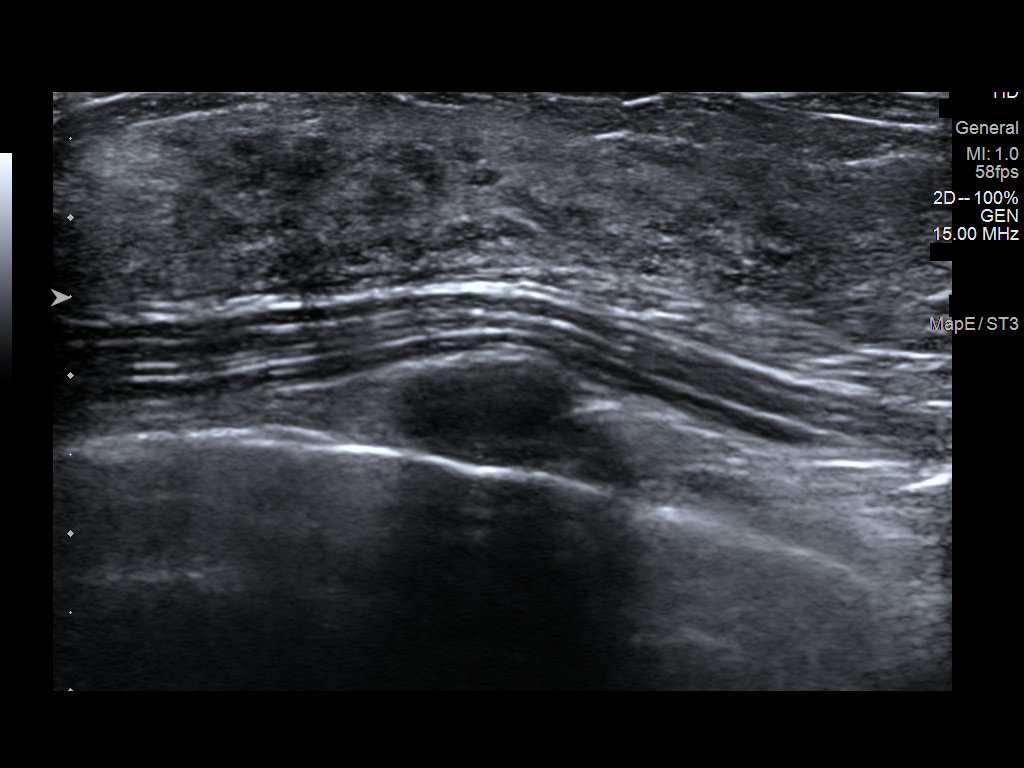
[im 3/6]
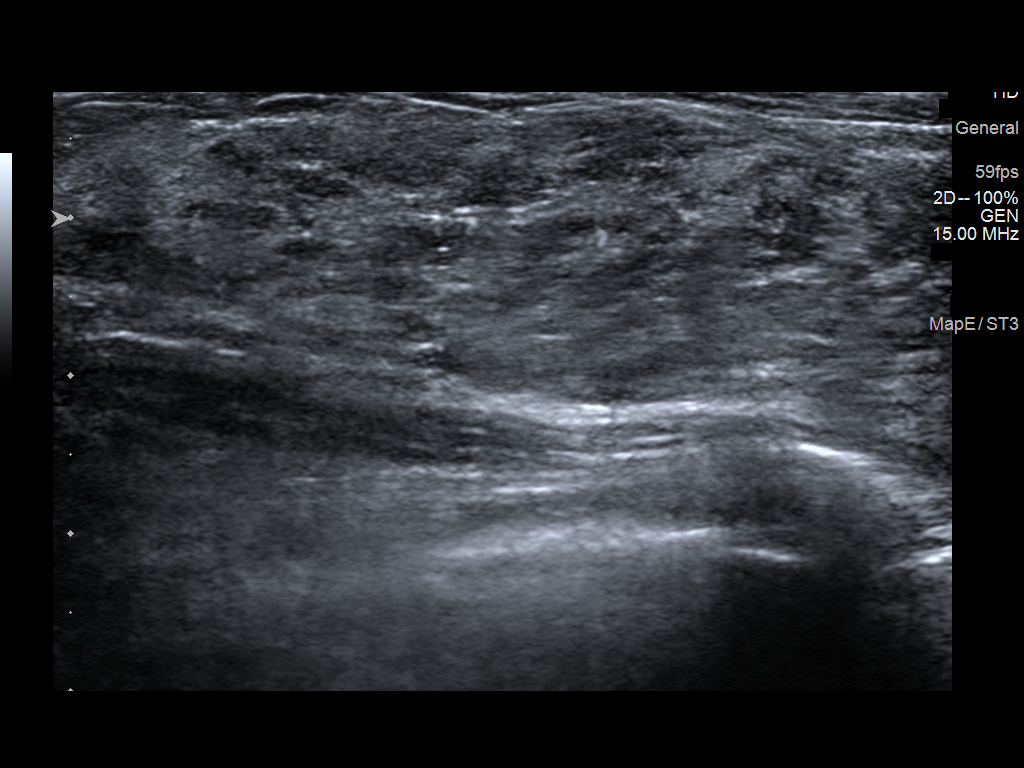
[im 4/6]
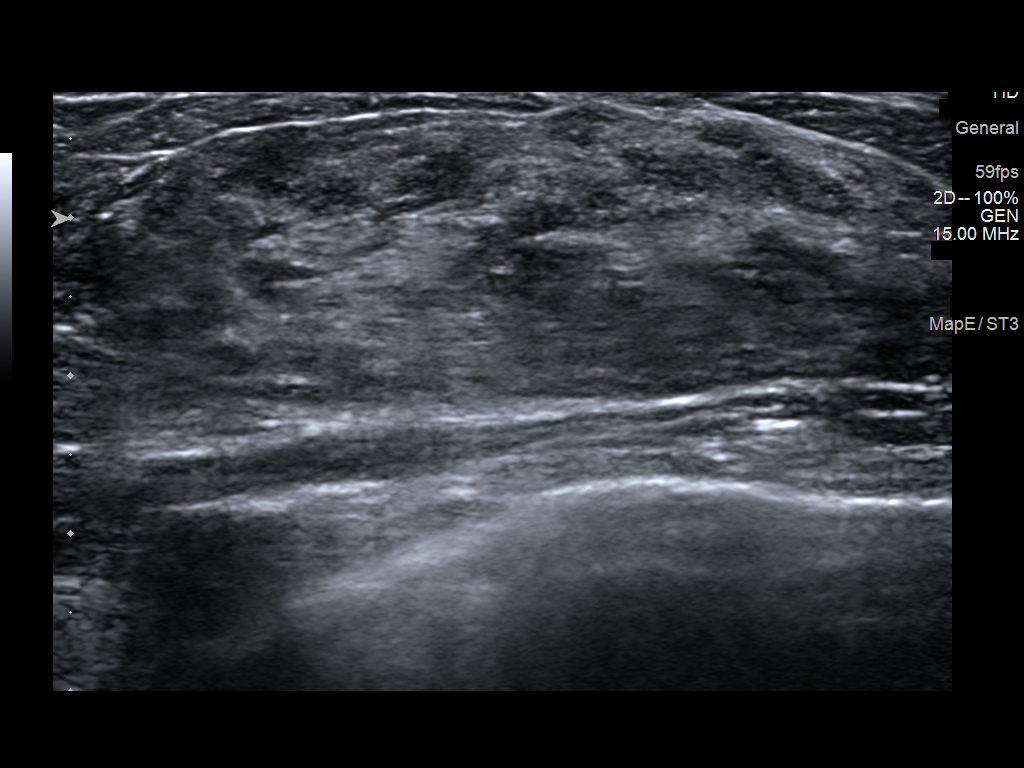
[im 5/6]
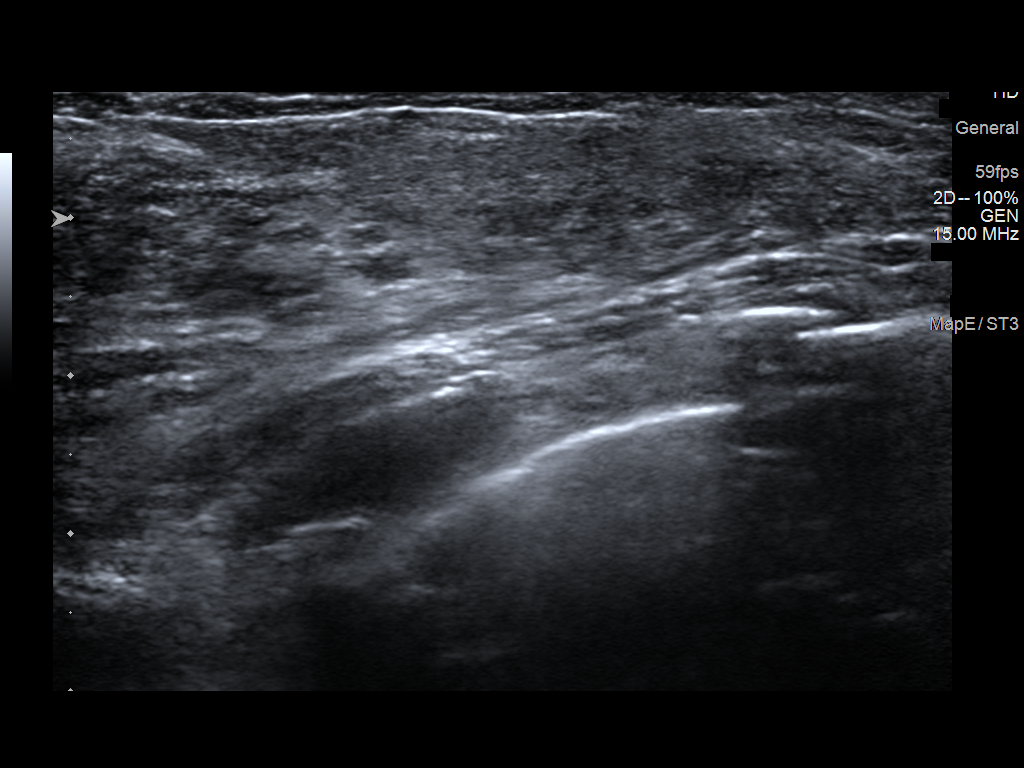
[im 6/6]
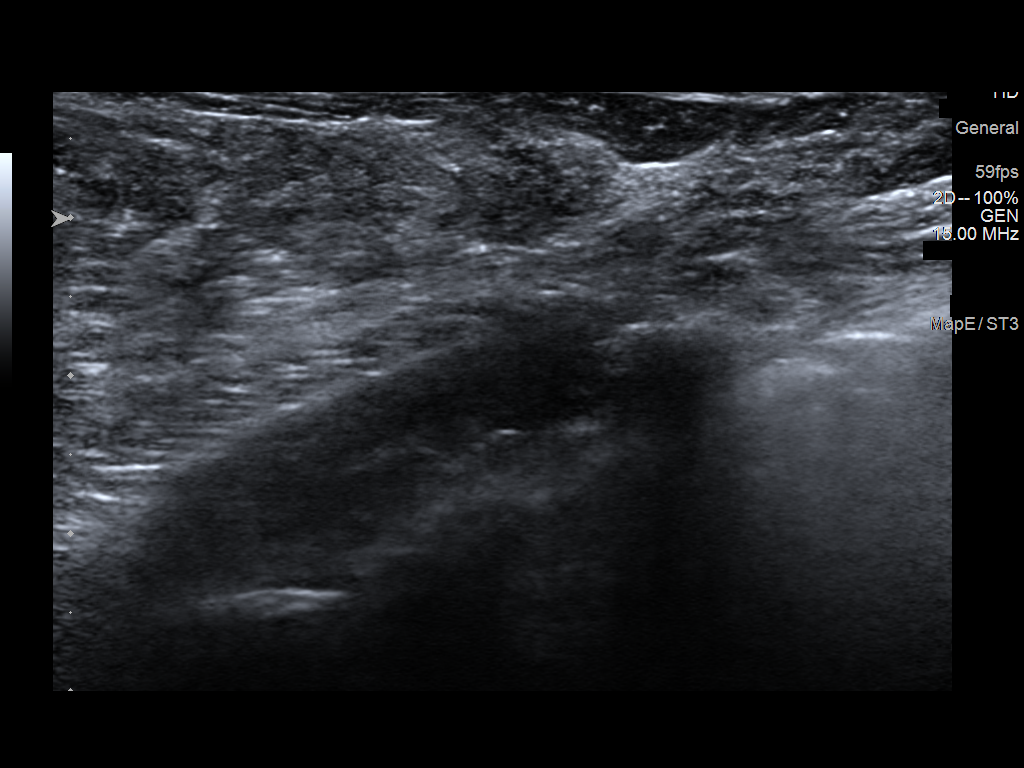

[6 of 6 positions shown; findings below may reference images not displayed]

ACR Breast Density Category d: The breast tissue is extremely dense,
which lowers the sensitivity of mammography.
FINDINGS: No suspicious calcifications, masses or areas of distortion are seen
in the left breast.

Mammographic images were processed with CAD.

On physical exam, there is a broad mobile palpable area in the
upper-outer quadrant of the left breast.

Ultrasound of the upper-outer quadrant of the left breast
demonstrates an area of dense fibroglandular tissue which
corresponds with the palpable area of concern. No suspicious
findings are seen correspond with the palpable lump for the region
of tenderness.
IMPRESSION: 1. There are no mammographic or targeted sonographic abnormalities
in the upper-outer quadrant of the left breast at the palpable site
or in the region of tenderness

RECOMMENDATION:
1. Clinical follow-up recommended for the palpable area and tender
of concern in the upper-outer left breast. Any further workup should
be based on clinical grounds.

2. Return to routine screening mammography is recommended. The
patient will be due for screening in [DATE].

I have discussed the findings and recommendations with the patient.
If applicable, a reminder letter will be sent to the patient
regarding the next appointment.

BI-RADS CATEGORY  1: Negative.

## 2019-03-26 IMAGING — MG MM DIGITAL DIAGNOSTIC UNILAT*L* W/ TOMO W/ CAD
4 series · 4 of 12 positions shown · non-contrast
Comparison: Previous exam(s).

CLINICAL DATA: 68-year-old female presenting for evaluation of
diffuse pain in the upper-outer left breast as well as a mobile mass
in the upper-outer quadrant of the left breast identified on
clinical breast exam. The patient had a fall 5 weeks ago while
walking her dog, and she hit the left breast.

EXAM:
DIGITAL DIAGNOSTIC LEFT MAMMOGRAM WITH CAD AND TOMO
ULTRASOUND LEFT BREAST

[L CC synth-2D]
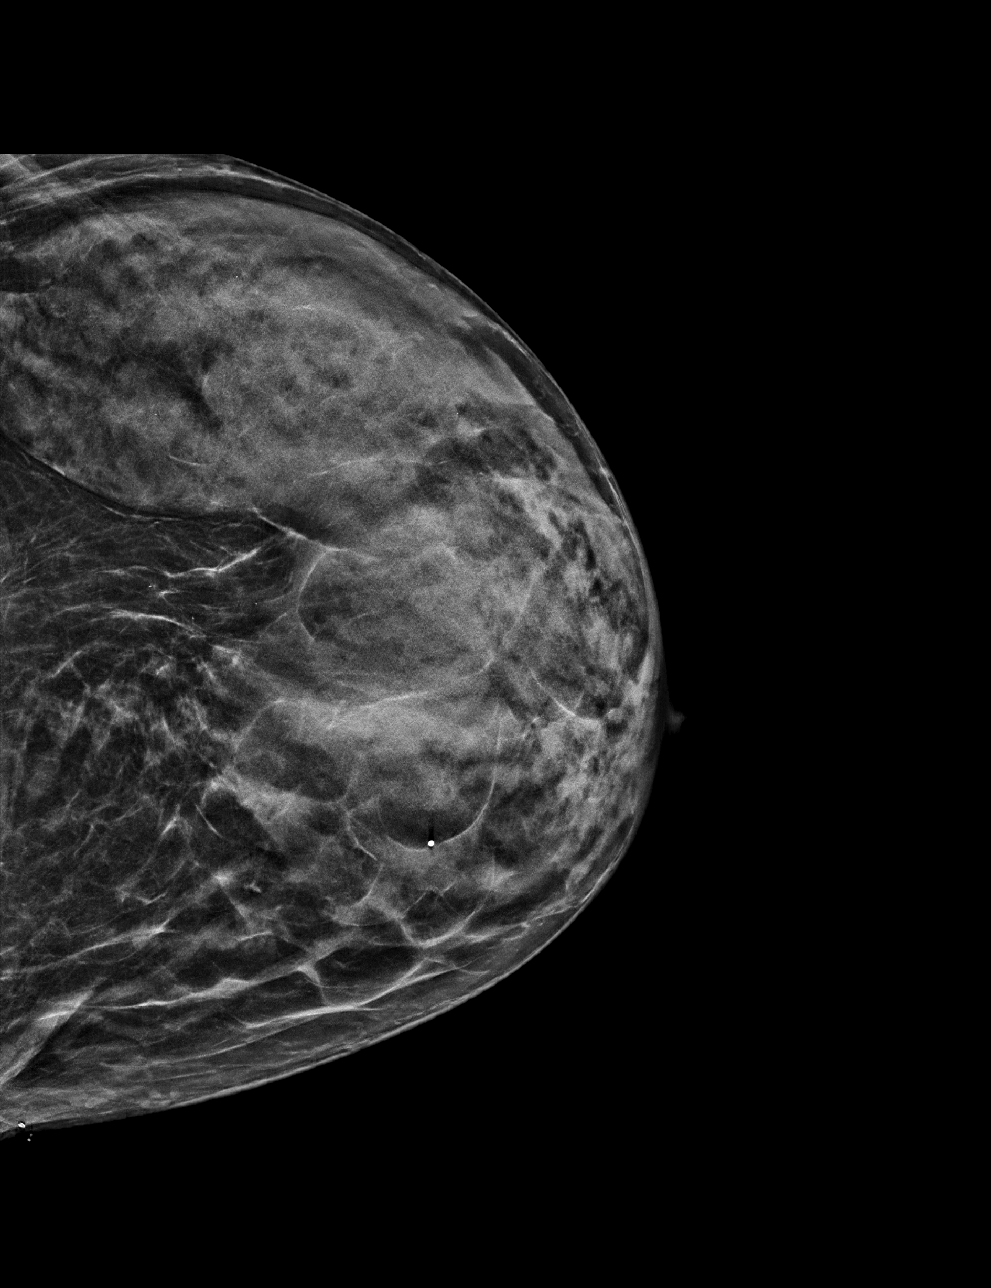

[L MLO synth-2D]
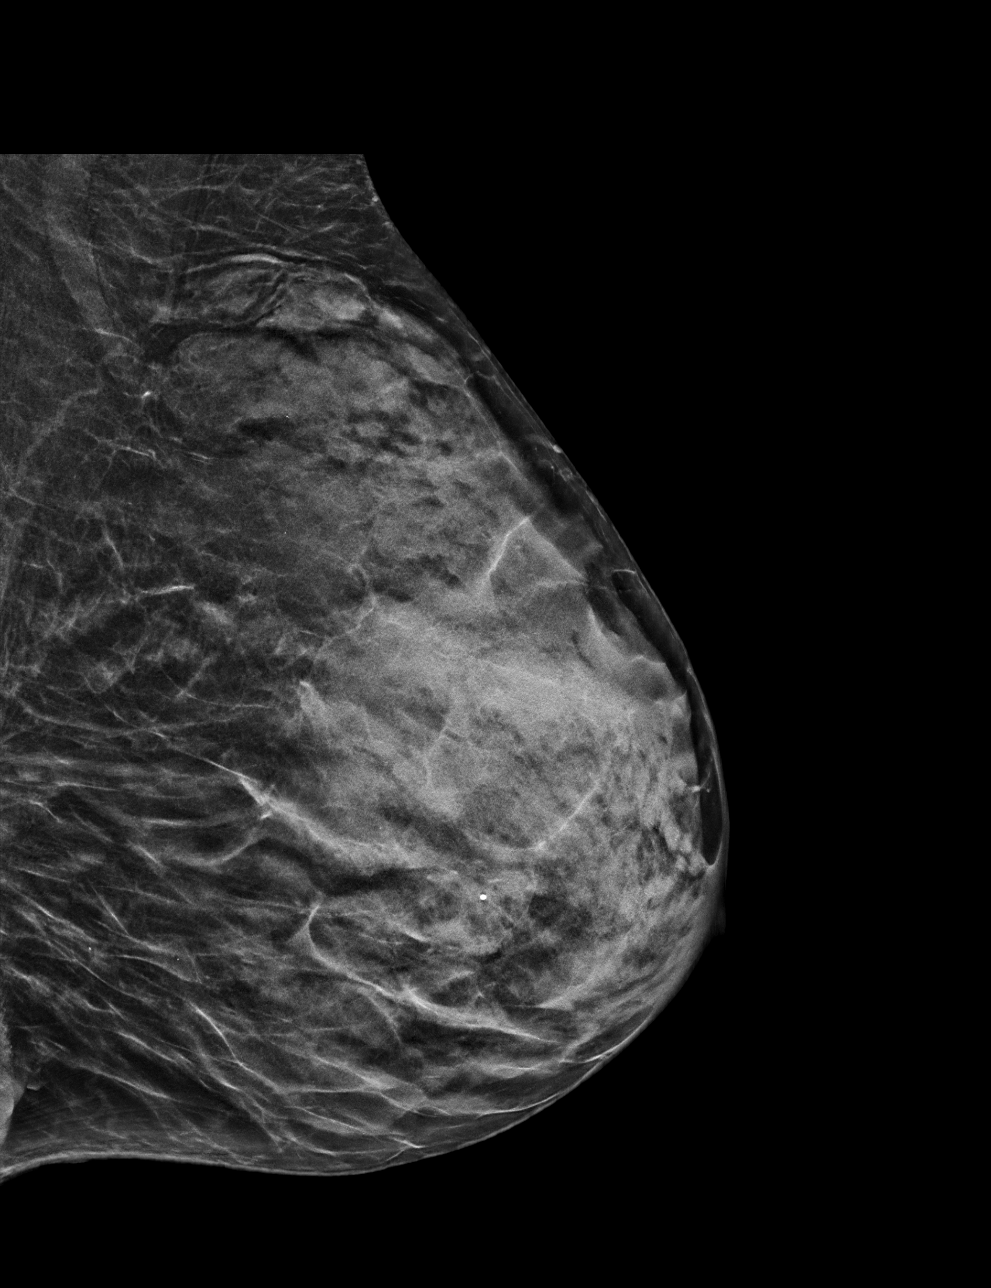

[L CC tomo · tomo slice 24/47.0]
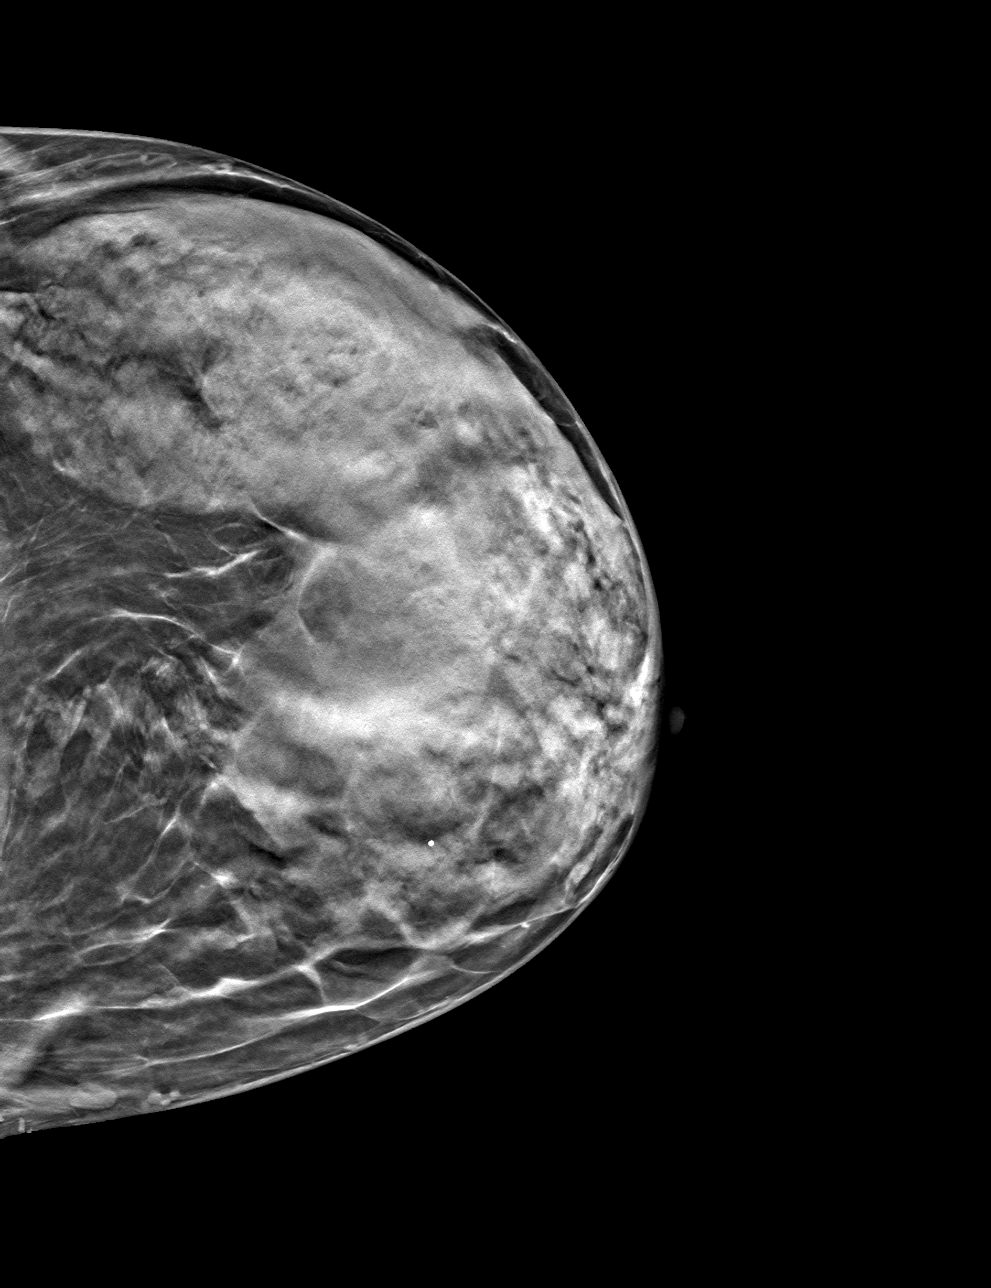

[L MLO tomo · tomo slice 23/45.0]
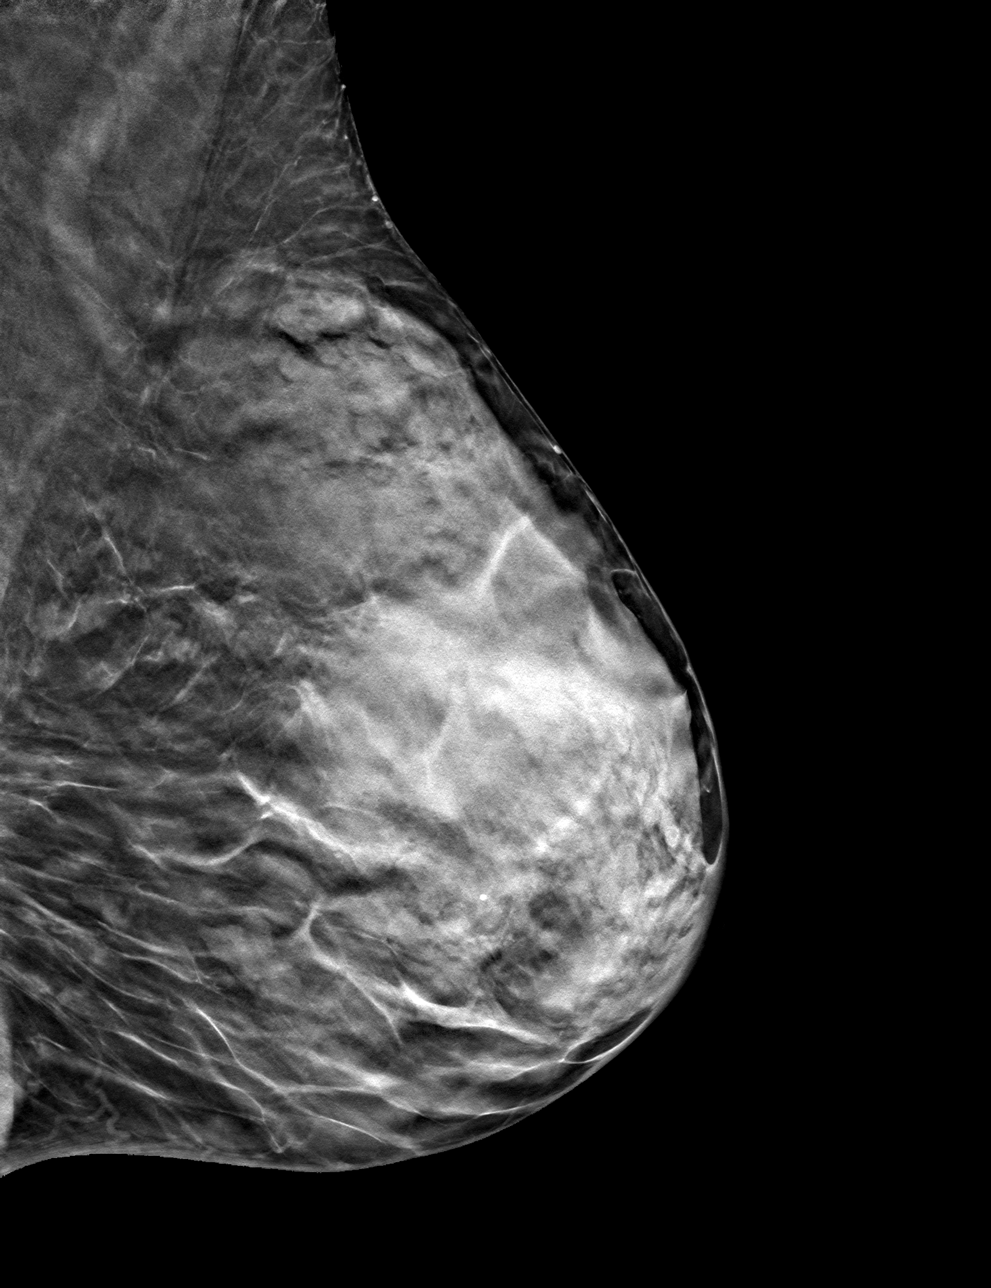

[4 of 12 positions shown; findings below may reference images not displayed]

ACR Breast Density Category d: The breast tissue is extremely dense,
which lowers the sensitivity of mammography.
FINDINGS: No suspicious calcifications, masses or areas of distortion are seen
in the left breast.

Mammographic images were processed with CAD.

On physical exam, there is a broad mobile palpable area in the
upper-outer quadrant of the left breast.

Ultrasound of the upper-outer quadrant of the left breast
demonstrates an area of dense fibroglandular tissue which
corresponds with the palpable area of concern. No suspicious
findings are seen correspond with the palpable lump for the region
of tenderness.
IMPRESSION: 1. There are no mammographic or targeted sonographic abnormalities
in the upper-outer quadrant of the left breast at the palpable site
or in the region of tenderness

RECOMMENDATION:
1. Clinical follow-up recommended for the palpable area and tender
of concern in the upper-outer left breast. Any further workup should
be based on clinical grounds.

2. Return to routine screening mammography is recommended. The
patient will be due for screening in [DATE].

I have discussed the findings and recommendations with the patient.
If applicable, a reminder letter will be sent to the patient
regarding the next appointment.

BI-RADS CATEGORY  1: Negative.

## 2019-04-08 DIAGNOSIS — Z23 Encounter for immunization: Secondary | ICD-10-CM | POA: Diagnosis not present

## 2019-04-08 DIAGNOSIS — M519 Unspecified thoracic, thoracolumbar and lumbosacral intervertebral disc disorder: Secondary | ICD-10-CM | POA: Diagnosis not present

## 2019-04-08 DIAGNOSIS — M810 Age-related osteoporosis without current pathological fracture: Secondary | ICD-10-CM | POA: Diagnosis not present

## 2019-04-08 DIAGNOSIS — E7800 Pure hypercholesterolemia, unspecified: Secondary | ICD-10-CM | POA: Diagnosis not present

## 2019-04-08 DIAGNOSIS — Q6100 Congenital renal cyst, unspecified: Secondary | ICD-10-CM | POA: Diagnosis not present

## 2019-04-08 DIAGNOSIS — Z0000 Encounter for general adult medical examination without abnormal findings: Secondary | ICD-10-CM | POA: Diagnosis not present

## 2019-04-08 DIAGNOSIS — G479 Sleep disorder, unspecified: Secondary | ICD-10-CM | POA: Diagnosis not present

## 2019-04-08 DIAGNOSIS — R7303 Prediabetes: Secondary | ICD-10-CM | POA: Diagnosis not present

## 2019-04-08 DIAGNOSIS — J309 Allergic rhinitis, unspecified: Secondary | ICD-10-CM | POA: Diagnosis not present

## 2019-04-08 DIAGNOSIS — Z79899 Other long term (current) drug therapy: Secondary | ICD-10-CM | POA: Diagnosis not present

## 2019-04-10 DIAGNOSIS — E7800 Pure hypercholesterolemia, unspecified: Secondary | ICD-10-CM | POA: Diagnosis not present

## 2019-04-10 DIAGNOSIS — Z79899 Other long term (current) drug therapy: Secondary | ICD-10-CM | POA: Diagnosis not present

## 2019-04-10 DIAGNOSIS — R7303 Prediabetes: Secondary | ICD-10-CM | POA: Diagnosis not present

## 2019-04-15 DIAGNOSIS — R69 Illness, unspecified: Secondary | ICD-10-CM | POA: Diagnosis not present

## 2019-04-30 DIAGNOSIS — R69 Illness, unspecified: Secondary | ICD-10-CM | POA: Diagnosis not present

## 2019-05-02 DIAGNOSIS — J22 Unspecified acute lower respiratory infection: Secondary | ICD-10-CM | POA: Diagnosis not present

## 2019-05-03 ENCOUNTER — Other Ambulatory Visit: Payer: Self-pay

## 2019-05-03 DIAGNOSIS — Z20822 Contact with and (suspected) exposure to covid-19: Secondary | ICD-10-CM

## 2019-05-04 LAB — NOVEL CORONAVIRUS, NAA: SARS-CoV-2, NAA: NOT DETECTED

## 2019-06-12 DIAGNOSIS — Z23 Encounter for immunization: Secondary | ICD-10-CM | POA: Diagnosis not present

## 2019-07-03 DIAGNOSIS — M810 Age-related osteoporosis without current pathological fracture: Secondary | ICD-10-CM | POA: Diagnosis not present

## 2019-07-09 DIAGNOSIS — M810 Age-related osteoporosis without current pathological fracture: Secondary | ICD-10-CM | POA: Diagnosis not present

## 2019-07-12 ENCOUNTER — Ambulatory Visit: Payer: Medicare HMO | Attending: Internal Medicine

## 2019-07-12 DIAGNOSIS — Z20822 Contact with and (suspected) exposure to covid-19: Secondary | ICD-10-CM | POA: Diagnosis not present

## 2019-07-14 LAB — NOVEL CORONAVIRUS, NAA: SARS-CoV-2, NAA: NOT DETECTED

## 2019-09-24 ENCOUNTER — Other Ambulatory Visit: Payer: Self-pay | Admitting: Obstetrics and Gynecology

## 2019-09-24 DIAGNOSIS — R69 Illness, unspecified: Secondary | ICD-10-CM | POA: Diagnosis not present

## 2019-09-24 DIAGNOSIS — Z1231 Encounter for screening mammogram for malignant neoplasm of breast: Secondary | ICD-10-CM

## 2019-09-27 DIAGNOSIS — R7303 Prediabetes: Secondary | ICD-10-CM | POA: Diagnosis not present

## 2019-09-27 DIAGNOSIS — R202 Paresthesia of skin: Secondary | ICD-10-CM | POA: Diagnosis not present

## 2019-10-08 DIAGNOSIS — I2510 Atherosclerotic heart disease of native coronary artery without angina pectoris: Secondary | ICD-10-CM | POA: Diagnosis not present

## 2019-10-08 DIAGNOSIS — M1990 Unspecified osteoarthritis, unspecified site: Secondary | ICD-10-CM | POA: Diagnosis not present

## 2019-10-08 DIAGNOSIS — J302 Other seasonal allergic rhinitis: Secondary | ICD-10-CM | POA: Diagnosis not present

## 2019-10-08 DIAGNOSIS — I700 Atherosclerosis of aorta: Secondary | ICD-10-CM | POA: Diagnosis not present

## 2019-10-15 DIAGNOSIS — H52203 Unspecified astigmatism, bilateral: Secondary | ICD-10-CM | POA: Diagnosis not present

## 2019-10-15 DIAGNOSIS — H524 Presbyopia: Secondary | ICD-10-CM | POA: Diagnosis not present

## 2019-10-15 DIAGNOSIS — H2513 Age-related nuclear cataract, bilateral: Secondary | ICD-10-CM | POA: Diagnosis not present

## 2019-10-15 DIAGNOSIS — H43813 Vitreous degeneration, bilateral: Secondary | ICD-10-CM | POA: Diagnosis not present

## 2019-10-17 DIAGNOSIS — Z0100 Encounter for examination of eyes and vision without abnormal findings: Secondary | ICD-10-CM | POA: Diagnosis not present

## 2019-10-22 ENCOUNTER — Ambulatory Visit: Payer: Medicare HMO | Admitting: Sports Medicine

## 2019-10-22 ENCOUNTER — Encounter: Payer: Self-pay | Admitting: Sports Medicine

## 2019-10-22 ENCOUNTER — Other Ambulatory Visit: Payer: Self-pay

## 2019-10-22 ENCOUNTER — Other Ambulatory Visit: Payer: Self-pay | Admitting: Sports Medicine

## 2019-10-22 ENCOUNTER — Ambulatory Visit (INDEPENDENT_AMBULATORY_CARE_PROVIDER_SITE_OTHER): Payer: Medicare HMO

## 2019-10-22 VITALS — Temp 97.9°F

## 2019-10-22 DIAGNOSIS — M21611 Bunion of right foot: Secondary | ICD-10-CM

## 2019-10-22 DIAGNOSIS — M2141 Flat foot [pes planus] (acquired), right foot: Secondary | ICD-10-CM

## 2019-10-22 DIAGNOSIS — M79672 Pain in left foot: Secondary | ICD-10-CM

## 2019-10-22 DIAGNOSIS — L603 Nail dystrophy: Secondary | ICD-10-CM | POA: Diagnosis not present

## 2019-10-22 DIAGNOSIS — M2170 Unequal limb length (acquired), unspecified site: Secondary | ICD-10-CM | POA: Diagnosis not present

## 2019-10-22 DIAGNOSIS — M21619 Bunion of unspecified foot: Secondary | ICD-10-CM

## 2019-10-22 DIAGNOSIS — M8588 Other specified disorders of bone density and structure, other site: Secondary | ICD-10-CM | POA: Diagnosis not present

## 2019-10-22 DIAGNOSIS — M79671 Pain in right foot: Secondary | ICD-10-CM

## 2019-10-22 DIAGNOSIS — R2681 Unsteadiness on feet: Secondary | ICD-10-CM

## 2019-10-22 DIAGNOSIS — M2040 Other hammer toe(s) (acquired), unspecified foot: Secondary | ICD-10-CM | POA: Diagnosis not present

## 2019-10-22 DIAGNOSIS — M2142 Flat foot [pes planus] (acquired), left foot: Secondary | ICD-10-CM | POA: Diagnosis not present

## 2019-10-22 DIAGNOSIS — M722 Plantar fascial fibromatosis: Secondary | ICD-10-CM

## 2019-10-22 DIAGNOSIS — M19079 Primary osteoarthritis, unspecified ankle and foot: Secondary | ICD-10-CM | POA: Diagnosis not present

## 2019-10-22 DIAGNOSIS — N958 Other specified menopausal and perimenopausal disorders: Secondary | ICD-10-CM | POA: Diagnosis not present

## 2019-10-22 NOTE — Progress Notes (Signed)
Subjective: Kiernan Farkas is a 69 y.o. female patient who presents to office for evaluation of bilateral foot pain reports that nothing drastically hurts but sometimes she does get flareup of pain worse on the left especially when she needs new orthotics reports that the left leg is shorter than the right and she needs a lift in her left shoe for her stability has orthotics but is interested in an getting new ones because the padding on the bottom feels then and no longer providing support especially at the ball of the left foot.  Patient also reports that she has a bunion on the right does not really hurt but wants it evaluated to avoid a future problem down the line.  Patient also reports that she has thick nails on the right leg do not bother her that want these assessed as well.  Patient denies any other pedal complaints or symptoms at this time.  There are no problems to display for this patient.   Current Outpatient Medications on File Prior to Visit  Medication Sig Dispense Refill  . calcium citrate (CALCITRATE - DOSED IN MG ELEMENTAL CALCIUM) 950 MG tablet Take 500 mg of elemental calcium by mouth daily.     . chlorhexidine (PERIDEX) 0.12 % solution chlorhexidine gluconate 0.12 % mouthwash  PLEASE SEE ATTACHED FOR DETAILED DIRECTIONS    . Chlorphen-PE-Acetaminophen 4-10-325 MG TABS Norel AD 4 mg-10 mg-325 mg tablet  TAKE 1 TABLET BY MOUTH AT BEDTIME AS NEEDED    . clarithromycin (BIAXIN) 250 MG tablet clarithromycin 250 mg tablet  TAKE 1 TABLET BY MOUTH TWICE DAILY (STOP SIMVASTATING WHILE TAKING THIS)    . diclofenac (CATAFLAM) 50 MG tablet Take 50 mg by mouth 3 (three) times daily.    . diclofenac (VOLTAREN) 50 MG EC tablet diclofenac sodium 50 mg tablet,delayed release  TAKE 1 TABLET BY MOUTH WITH FOOD OR MILK AS NEEDED ONCE A DAY    . Influenza vac split quadrivalent PF (FLUZONE HIGH-DOSE) 0.5 ML injection Fluzone High-Dose 2019-20 (PF) 180 mcg/0.5 mL intramuscular syringe  ADM 0.5ML IM  UTD    . loratadine (CLARITIN) 10 MG tablet Take 10 mg by mouth daily.    . Multiple Vitamins-Minerals (MULTIVITAMIN PO) Take 1 tablet by mouth daily.    . multivitamin-iron-minerals-folic acid (CENTRUM) chewable tablet Chew 1 tablet by mouth daily.    Marland Kitchen oxybutynin (DITROPAN-XL) 5 MG 24 hr tablet oxybutynin chloride ER 5 mg tablet,extended release 24 hr  Take 1 tablet by mouth once daily    . simvastatin (ZOCOR) 10 MG tablet simvastatin 10 mg tablet  TAKE 1 TABLET BY MOUTH EVERY DAY IN THE EVENING    . tiZANidine (ZANAFLEX) 2 MG tablet Take 1 mg by mouth every 6 (six) hours as needed for muscle spasms.      No current facility-administered medications on file prior to visit.    Allergies  Allergen Reactions  . Sulfa Antibiotics Rash    Objective:  General: Alert and oriented x3 in no acute distress  Dermatology: No open lesions bilateral lower extremities, no webspace macerations, no ecchymosis bilateral, all nails x 10 are well manicured with the right first through fifth toenails thickened and discolored likely due to mechanical pressure since the right leg is longer and the mechanical changes of the bunion and hammertoe more progressive on the right.  Vascular: Dorsalis Pedis and Posterior Tibial pedal pulses palpable, Capillary Fill Time 3 seconds,(+) pedal hair growth bilateral, no edema bilateral lower extremities, Temperature gradient within normal  limits.  Neurology: Johney Maine sensation intact via light touch bilateral.  Musculoskeletal: Mild tenderness with palpation at ball of the left foot worse when she is in the orthotics so she has added an additional pad at the ball of the left foot and reports that the ball area of more orthotic feels very thin.  Patient reports padding helps with any pain on the ball of the left.  Structural bunion and pes planus and hammertoe deformity noted bilateral right greater than left.  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  No fracture.  No other acute findings.  Assessment and Plan: Problem List Items Addressed This Visit    None    Visit Diagnoses    Bilateral foot pain    -  Primary   Relevant Orders   DG Foot Complete Left (Completed)   Pes planus of both feet       Bunion       Arthritis of foot       Relevant Medications   diclofenac (VOLTAREN) 50 MG EC tablet   Hammer toe, unspecified laterality       Lower limb length difference       Gait instability       Nail dystrophy           -Complete examination performed -Xrays reviewed -Discussed treatement options for occasional mechanical foot pain as well as limb length difference and gait instability -Rx CT scanogram to identify the true difference between each limb -Patient also met with Liliane Channel today for assessment for new orthotics however at this time we will wait to cast her for new orthotics and to make any additional accommodations for the limb length discrepancy until we have the results of the CT scanogram -Advised patient that we will closely monitor her bunions will not pursue any treatment since they are not painful at this time -Advised patient to continue with daily trimming and filing of toenails and if the nails continue to thicken may benefit from using [place] of which she can purchase over-the-counter from our office if she decides that she wants to try this -Patient to return to office after CT or sooner if condition worsens.  Landis Martins, DPM

## 2019-10-23 ENCOUNTER — Telehealth: Payer: Self-pay | Admitting: *Deleted

## 2019-10-23 DIAGNOSIS — R2681 Unsteadiness on feet: Secondary | ICD-10-CM

## 2019-10-23 DIAGNOSIS — L603 Nail dystrophy: Secondary | ICD-10-CM

## 2019-10-23 DIAGNOSIS — M2170 Unequal limb length (acquired), unspecified site: Secondary | ICD-10-CM

## 2019-10-23 NOTE — Telephone Encounter (Signed)
Orders to Dr. Leeanne Rio assistant for pre-cert, faxed to [facility].

## 2019-10-23 NOTE — Telephone Encounter (Signed)
-----   Message from [place], [place] sent at 10/22/2019 12:14 PM EDT ----- Regarding: Order CT scanogram CT to measure for limb length differences History of left side shorter wears leg lift

## 2019-10-28 ENCOUNTER — Other Ambulatory Visit: Payer: Self-pay

## 2019-10-28 ENCOUNTER — Ambulatory Visit
Admission: RE | Admit: 2019-10-28 | Discharge: 2019-10-28 | Disposition: A | Discharge status: Home / Self Care | Payer: Medicare HMO | Source: Ambulatory Visit | Attending: Obstetrics and Gynecology | Admitting: Obstetrics and Gynecology

## 2019-10-28 DIAGNOSIS — Z1231 Encounter for screening mammogram for malignant neoplasm of breast: Secondary | ICD-10-CM | POA: Diagnosis not present

## 2019-10-28 IMAGING — MG DIGITAL SCREENING BILAT W/ TOMO W/ CAD
8 series · 9 of 24 positions shown · non-contrast
Comparison: Previous exam(s).

CLINICAL DATA: Screening.

EXAM:
DIGITAL SCREENING BILATERAL MAMMOGRAM WITH TOMO AND CAD

[R MLO synth-2D]
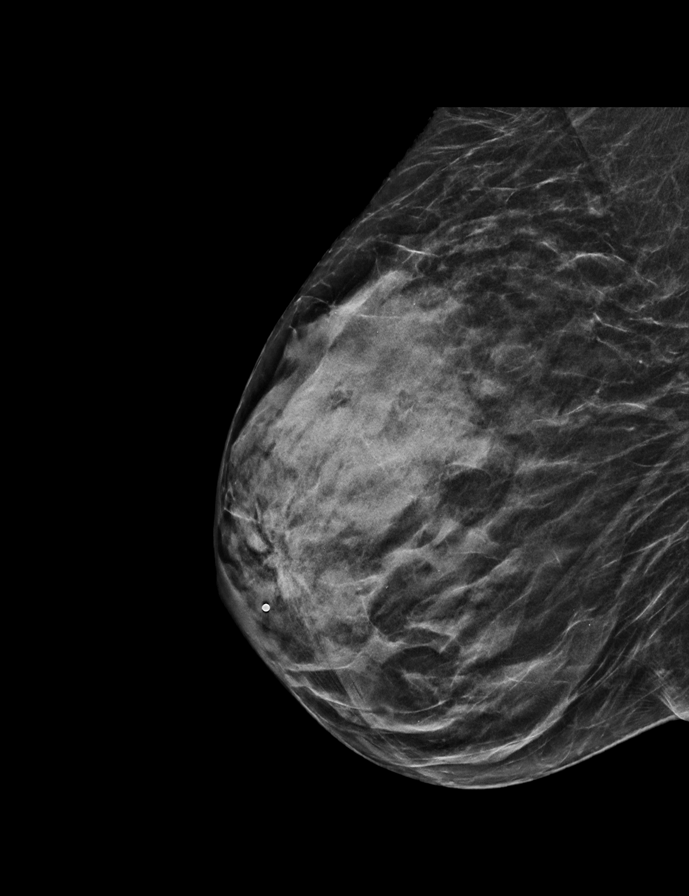

[L MLO synth-2D]
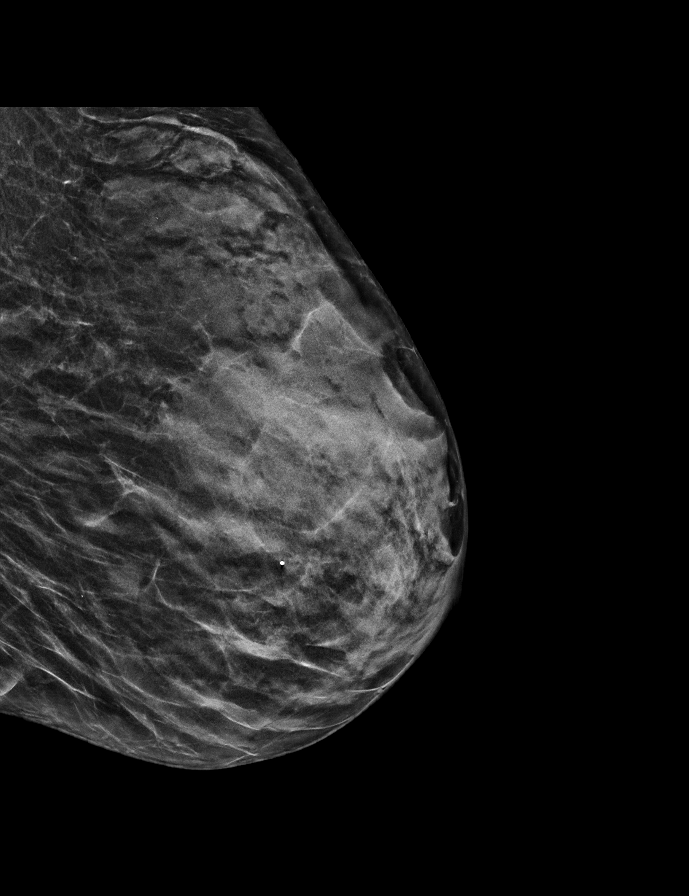

[L CC synth-2D]
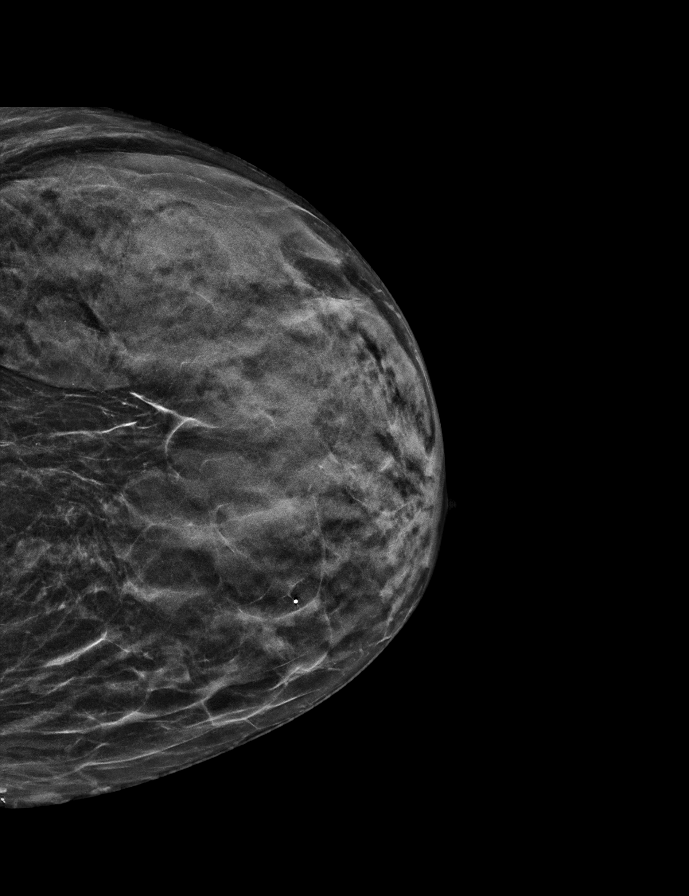

[R CC synth-2D]
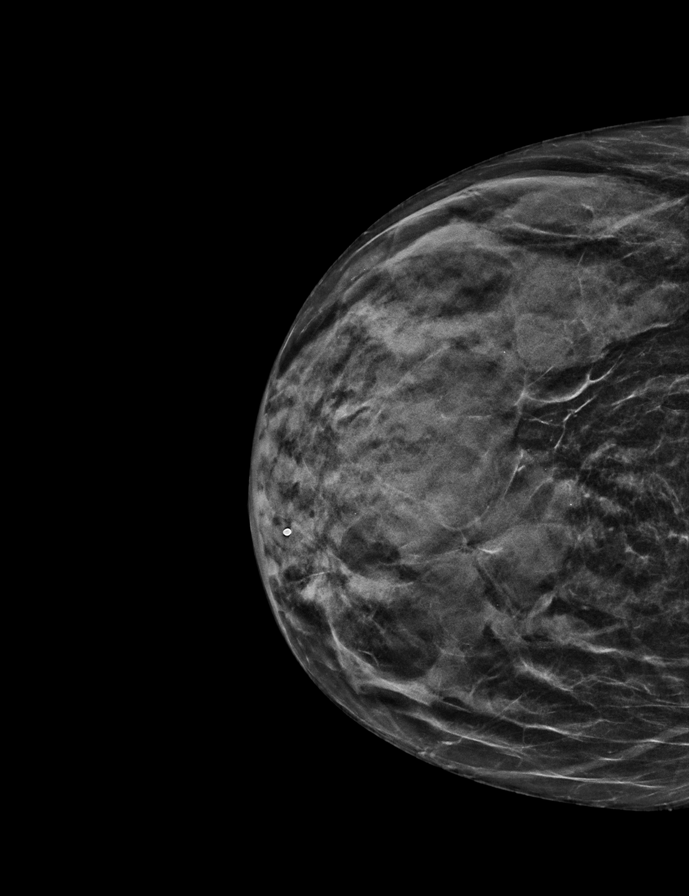

[L MLO tomo · 2 of 45 frames shown]
[frame 15/45]
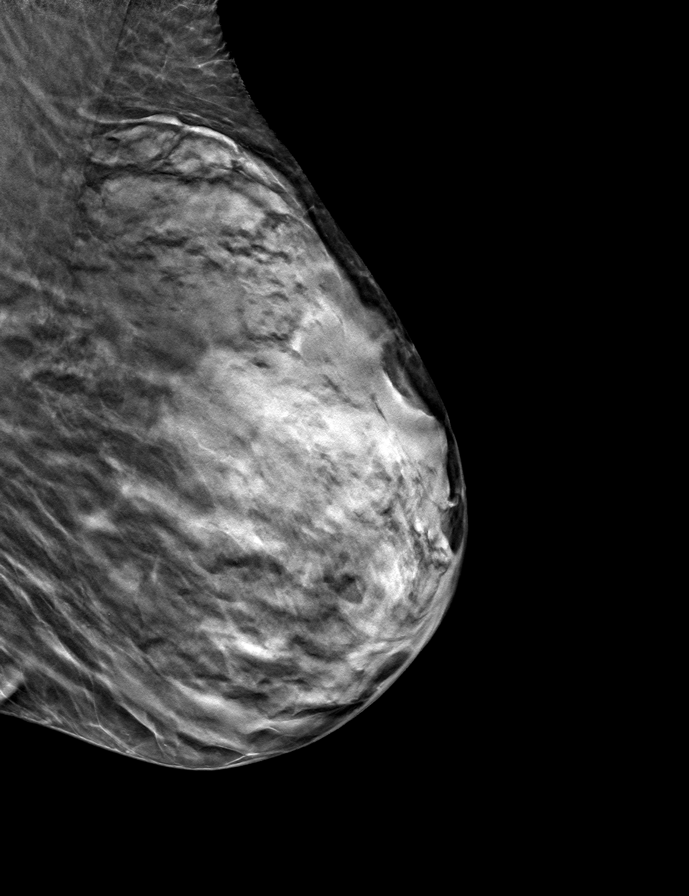
[frame 23/45]
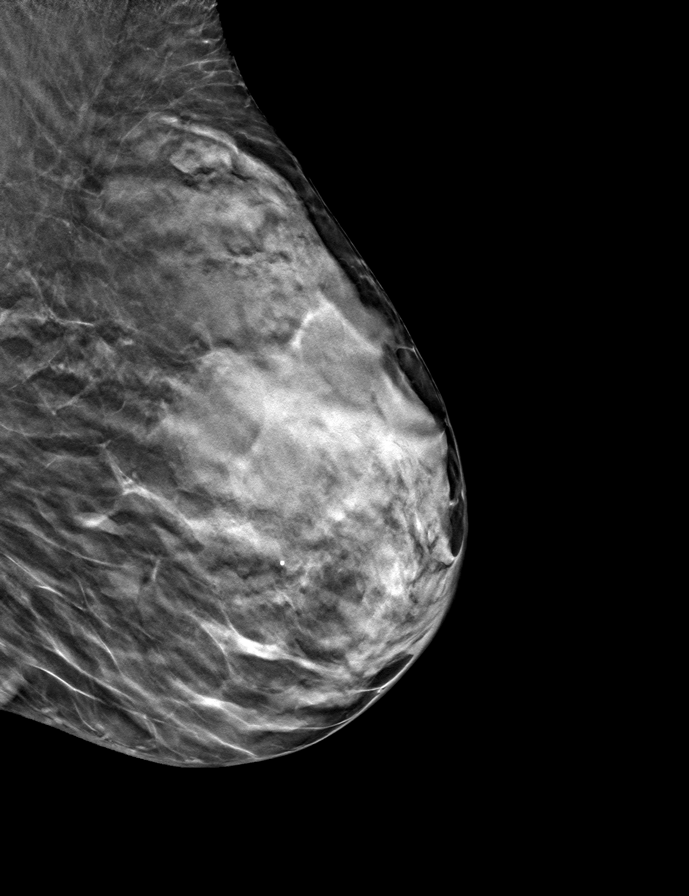

[L CC tomo · tomo slice 22/43.0]
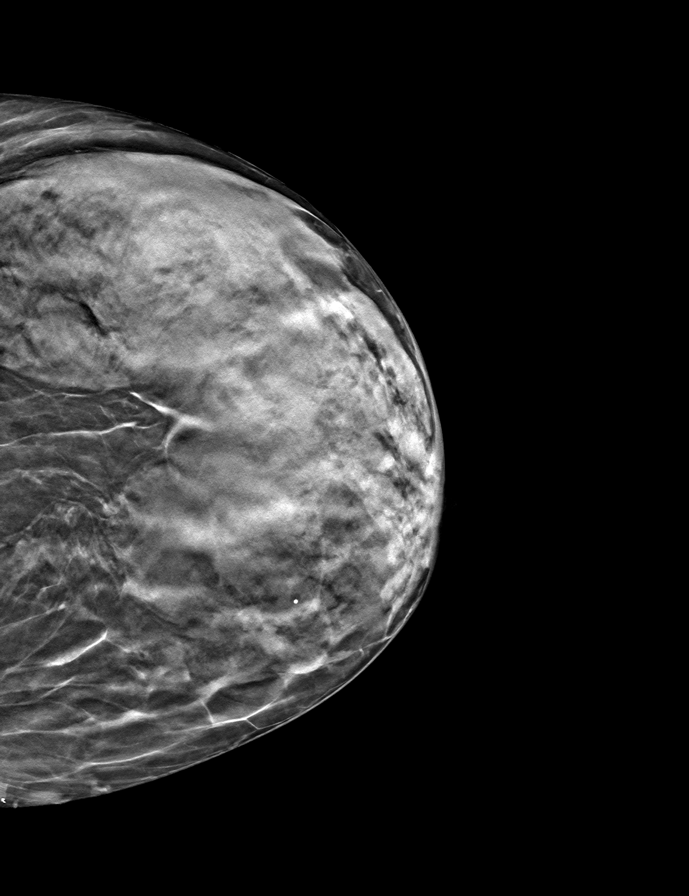

[R CC tomo · tomo slice 22/43.0]
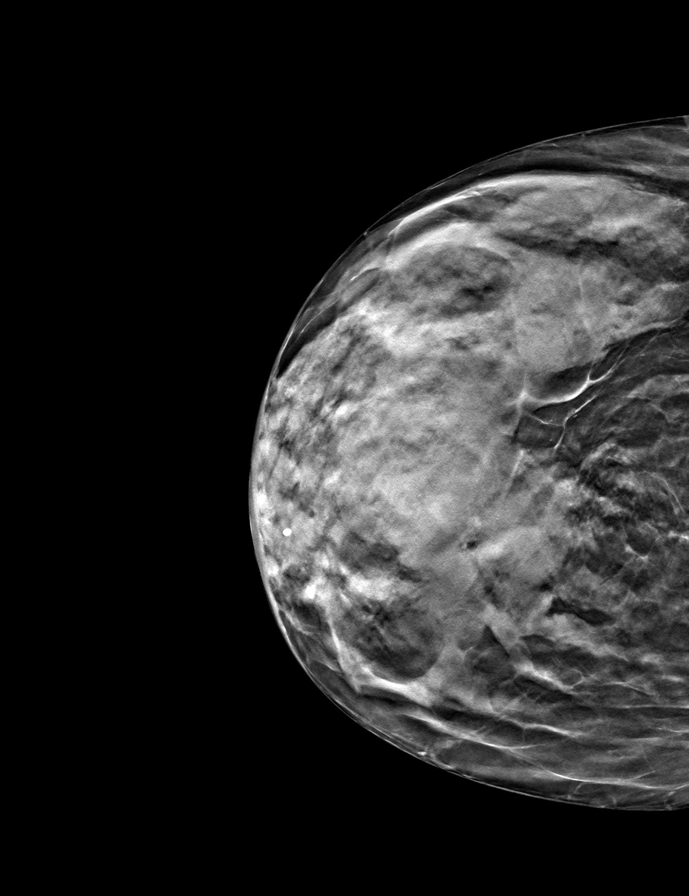

[R MLO tomo · tomo slice 23/44.0]
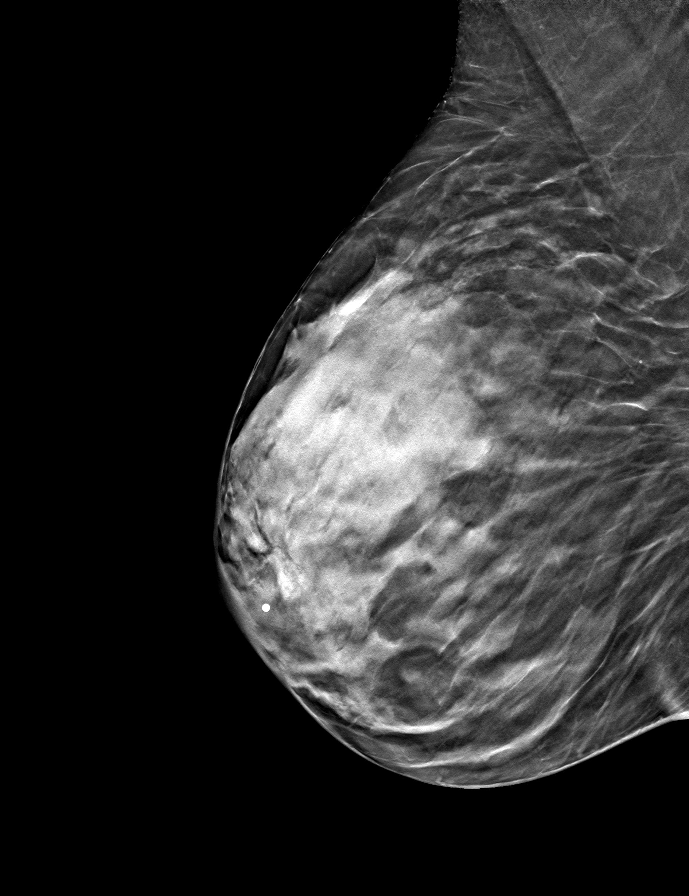

[9 of 24 positions shown; findings below may reference images not displayed]

ACR Breast Density Category d: The breast tissue is extremely dense,
which lowers the sensitivity of mammography
FINDINGS: There are no findings suspicious for malignancy. Images were
processed with CAD.
IMPRESSION: No mammographic evidence of malignancy. A result letter of this
screening mammogram will be mailed directly to the patient.

RECOMMENDATION:
Screening mammogram in one year. (Code:[5I])

BI-RADS CATEGORY  1: Negative.

## 2019-10-29 ENCOUNTER — Ambulatory Visit: Payer: Medicare HMO

## 2019-10-30 ENCOUNTER — Telehealth: Payer: Self-pay | Admitting: *Deleted

## 2019-10-30 NOTE — Telephone Encounter (Signed)
Called [employer] and got the automated service and the procedure code [id number] does not require prior authorization and the reference number is [id number] and I did speak to a representative about the procedure code [phone] and the representative stated that the procedure code [phone] does not need prior authorization and the reference number for that was [id number]. Lattie Haw

## 2019-11-12 ENCOUNTER — Ambulatory Visit
Admission: RE | Admit: 2019-11-12 | Discharge: 2019-11-12 | Disposition: A | Discharge status: Home / Self Care | Payer: Medicare HMO | Source: Ambulatory Visit | Attending: Sports Medicine | Admitting: Sports Medicine

## 2019-11-12 ENCOUNTER — Other Ambulatory Visit: Payer: Self-pay

## 2019-11-12 DIAGNOSIS — R2681 Unsteadiness on feet: Secondary | ICD-10-CM

## 2019-11-12 DIAGNOSIS — M21761 Unequal limb length (acquired), right tibia: Secondary | ICD-10-CM | POA: Diagnosis not present

## 2019-11-12 DIAGNOSIS — M21762 Unequal limb length (acquired), left tibia: Secondary | ICD-10-CM | POA: Diagnosis not present

## 2019-11-12 DIAGNOSIS — M2170 Unequal limb length (acquired), unspecified site: Secondary | ICD-10-CM

## 2019-11-12 IMAGING — CT CT BONE LENGTH
1 series · 1 of 1 positions shown · non-contrast
Comparison: None.

CLINICAL DATA: Leg length discrepancy.

EXAM:
CT OF THE LOWER BILATERAL EXTREMITY WITHOUT CONTRAST
TECHNIQUE: Scanogram CT image of the lower bilateral extremity was performed
according to the standard protocol.

[Series 1: topogram 0.6 t80f · coronal · 0.6mm · 3.00mm/px · 1 of 1 slices shown]
[im 1/1]
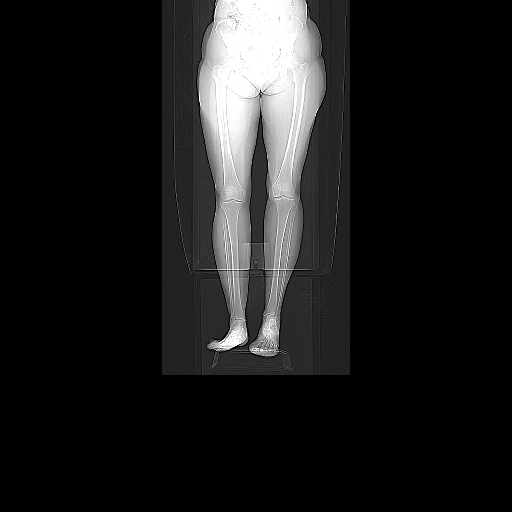

[1 of 1 positions shown; findings below may reference images not displayed]

FINDINGS: Bones/Joint/Cartilage

As measured from the central aspect of the femoral heads to the
central tibial plafond, the right leg measures 78.4 cm and the left
leg measures 77.9 cm. Visualization is limited but the patient has
degenerative disease about the hips.
IMPRESSION: Leg length as above.

## 2019-11-20 ENCOUNTER — Other Ambulatory Visit: Payer: Self-pay

## 2019-11-20 ENCOUNTER — Other Ambulatory Visit: Payer: Medicare HMO | Admitting: Orthotics

## 2019-11-21 ENCOUNTER — Telehealth: Payer: Self-pay | Admitting: Sports Medicine

## 2019-11-21 NOTE — Telephone Encounter (Signed)
Pt left message yesterday and has some questions about the inserts that you gave her. Please call her.

## 2019-11-28 DIAGNOSIS — M545 Low back pain: Secondary | ICD-10-CM | POA: Diagnosis not present

## 2019-11-29 ENCOUNTER — Other Ambulatory Visit: Payer: Self-pay | Admitting: Orthopedic Surgery

## 2019-11-29 ENCOUNTER — Telehealth: Payer: Self-pay | Admitting: Nurse Practitioner

## 2019-11-29 DIAGNOSIS — G8929 Other chronic pain: Secondary | ICD-10-CM

## 2019-11-29 NOTE — Telephone Encounter (Signed)
Phone call to patient to verify medication list and allergies for myelogram procedure. Pt aware she will not need to hold any medications for this procedure. Pre and post procedure instructions reviewed with pt. Pt verbalized understanding. 

## 2019-12-16 ENCOUNTER — Other Ambulatory Visit: Payer: Self-pay

## 2019-12-16 ENCOUNTER — Ambulatory Visit (INDEPENDENT_AMBULATORY_CARE_PROVIDER_SITE_OTHER): Payer: Medicare HMO | Admitting: Orthotics

## 2019-12-16 DIAGNOSIS — M79676 Pain in unspecified toe(s): Secondary | ICD-10-CM

## 2019-12-16 DIAGNOSIS — M2170 Unequal limb length (acquired), unspecified site: Secondary | ICD-10-CM

## 2019-12-16 NOTE — Progress Notes (Signed)
Cast for cmfo to address leg length discrepency and metatarsalgia. 3/8 lift on left.

## 2019-12-18 ENCOUNTER — Other Ambulatory Visit: Payer: Medicare HMO

## 2019-12-31 DIAGNOSIS — H10411 Chronic giant papillary conjunctivitis, right eye: Secondary | ICD-10-CM | POA: Diagnosis not present

## 2020-01-07 ENCOUNTER — Ambulatory Visit: Payer: Medicare HMO | Admitting: Orthotics

## 2020-01-07 ENCOUNTER — Other Ambulatory Visit: Payer: Self-pay

## 2020-01-07 DIAGNOSIS — M2170 Unequal limb length (acquired), unspecified site: Secondary | ICD-10-CM

## 2020-01-07 DIAGNOSIS — M810 Age-related osteoporosis without current pathological fracture: Secondary | ICD-10-CM | POA: Diagnosis not present

## 2020-01-07 DIAGNOSIS — R2681 Unsteadiness on feet: Secondary | ICD-10-CM

## 2020-01-07 DIAGNOSIS — L603 Nail dystrophy: Secondary | ICD-10-CM

## 2020-01-07 NOTE — Progress Notes (Signed)
Patient p/up refurb f/o.

## 2020-01-10 ENCOUNTER — Other Ambulatory Visit: Payer: Self-pay | Admitting: Orthopedic Surgery

## 2020-01-10 DIAGNOSIS — M545 Low back pain, unspecified: Secondary | ICD-10-CM

## 2020-01-15 DIAGNOSIS — M810 Age-related osteoporosis without current pathological fracture: Secondary | ICD-10-CM | POA: Diagnosis not present

## 2020-01-24 ENCOUNTER — Ambulatory Visit
Admission: RE | Admit: 2020-01-24 | Discharge: 2020-01-24 | Disposition: A | Discharge status: Home / Self Care | Payer: Medicare HMO | Source: Ambulatory Visit | Attending: Orthopedic Surgery | Admitting: Orthopedic Surgery

## 2020-01-24 ENCOUNTER — Other Ambulatory Visit: Payer: Self-pay

## 2020-01-24 DIAGNOSIS — M545 Low back pain, unspecified: Secondary | ICD-10-CM

## 2020-01-24 DIAGNOSIS — M48061 Spinal stenosis, lumbar region without neurogenic claudication: Secondary | ICD-10-CM | POA: Diagnosis not present

## 2020-01-24 IMAGING — MR MR LUMBAR SPINE W/O CM
5 series · 44 of 48 positions shown · non-contrast
Comparison: None.

CLINICAL DATA: Chronic persistent left leg weakness and numbness.

EXAM:
MRI LUMBAR SPINE WITHOUT CONTRAST
TECHNIQUE: Multiplanar, multisequence MR imaging of the lumbar spine was
performed. No intravenous contrast was administered.

[Series 3: T2 · sagittal · 4.0mm · 0.88mm/px · 6 of 13 slices shown (1 of 2)]
[im 1/13]
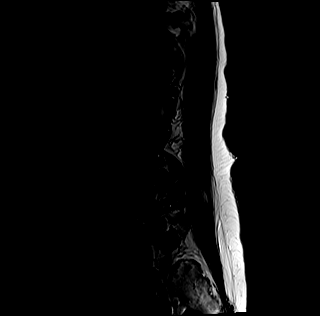
[im 3/13]
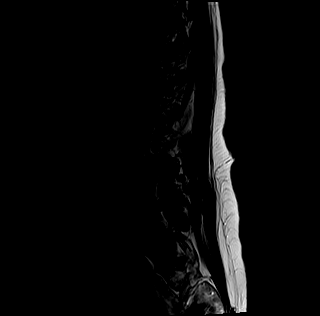
[im 5/13]
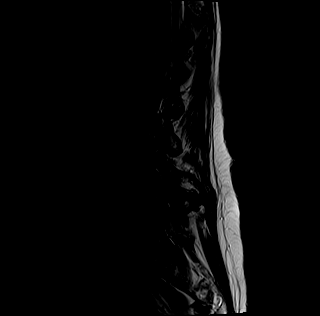
[im 8/13]
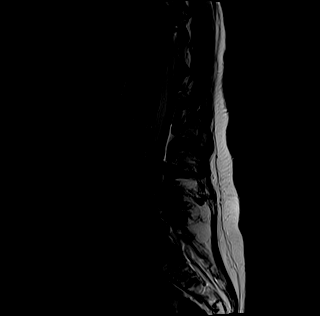
[im 10/13]
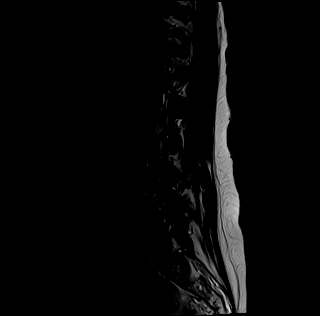
[im 13/13]
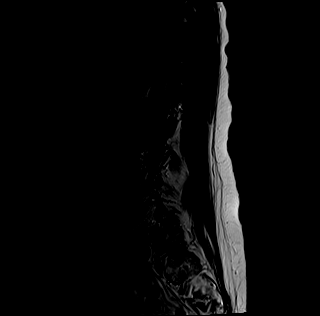

[Series 4: STIR · sagittal · 4.0mm · 0.55mm/px · 6 of 13 slices shown]
[im 1/13]
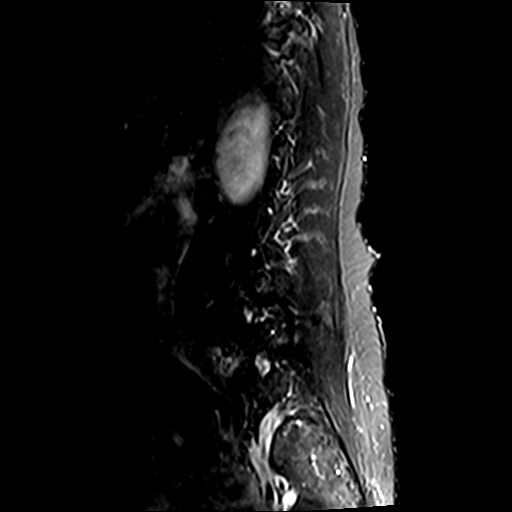
[im 3/13]
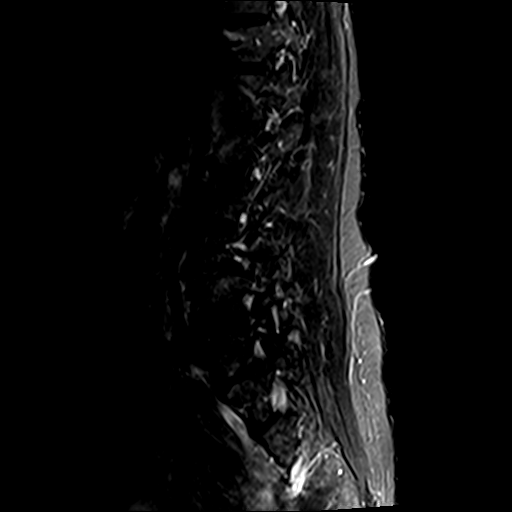
[im 5/13]
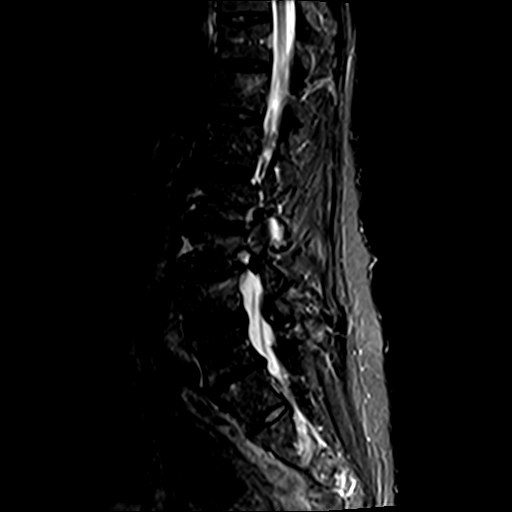
[im 8/13]
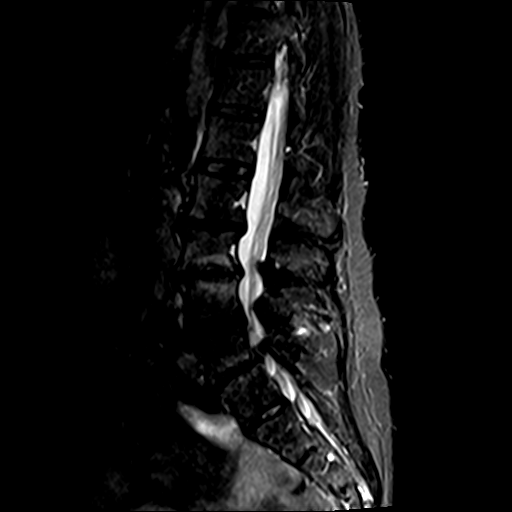
[im 10/13]
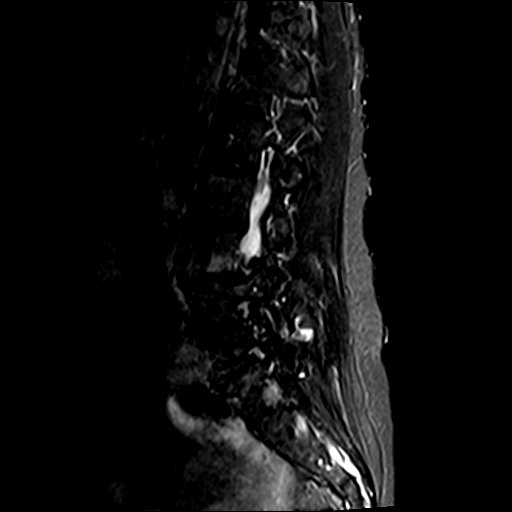
[im 13/13]
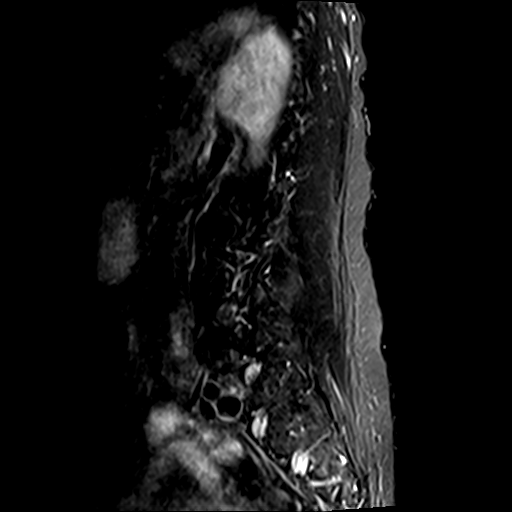

[Series 5: T1 · sagittal · 4.0mm · 0.88mm/px · 6 of 13 slices shown (1 of 2)]
[im 1/13]
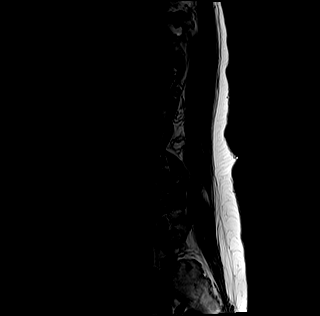
[im 3/13]
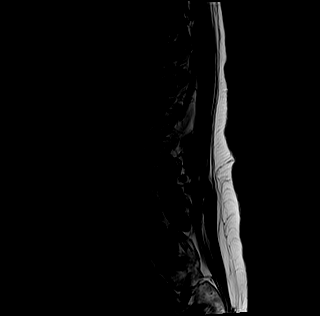
[im 5/13]
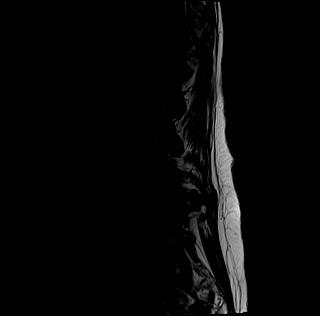
[im 8/13]
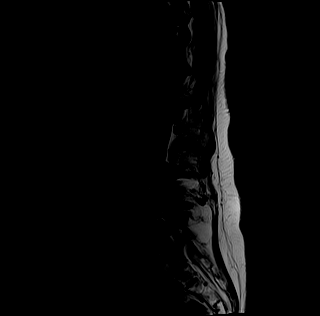
[im 10/13]
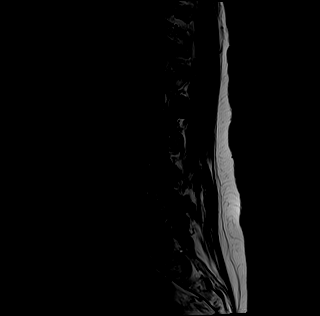
[im 13/13]
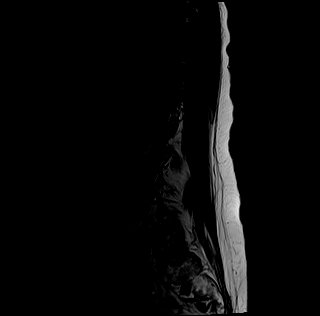

[Series 6: T2 · axial · 4.0mm · 0.70mm/px · z∈[-96,+76]mm · 15 of 33 slices shown (2 of 2)]
[im 1/33]
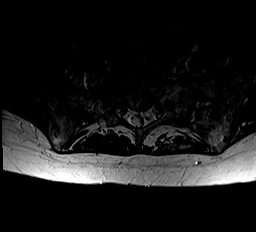
[im 3/33]
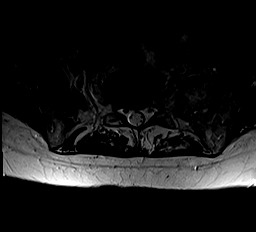
[im 5/33]
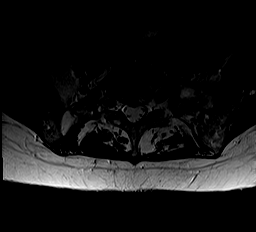
[im 7/33]
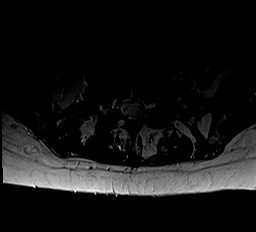
[im 10/33]
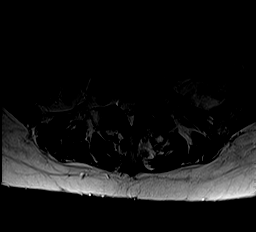
[im 12/33]
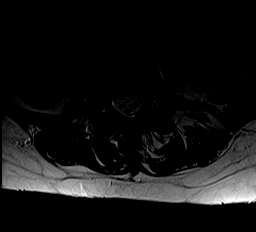
[im 14/33]
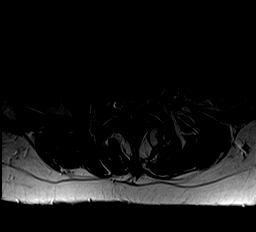
[im 17/33]
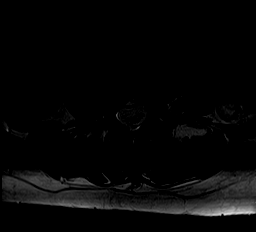
[im 19/33]
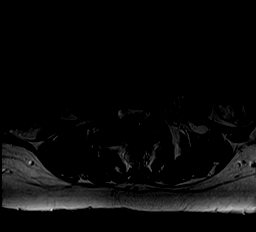
[im 21/33]
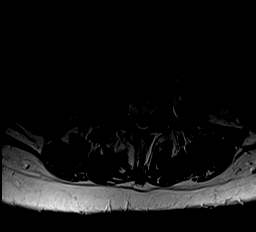
[im 23/33]
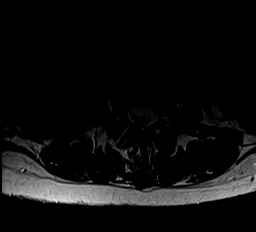
[im 26/33]
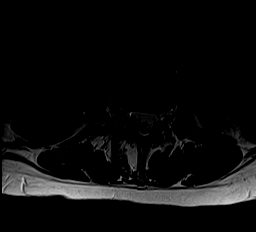
[im 28/33]
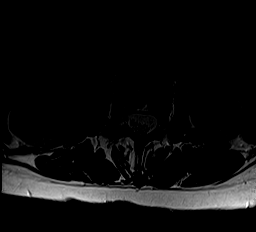
[im 30/33]
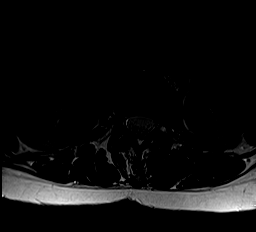
[im 33/33]
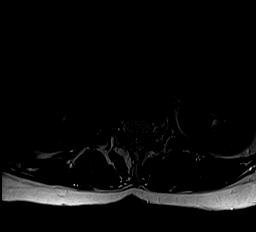

[Series 7: T1 · axial · 4.0mm · 0.70mm/px · z∈[-99,+76]mm · 11 of 33 slices shown (2 of 2)]
[im 1/33]
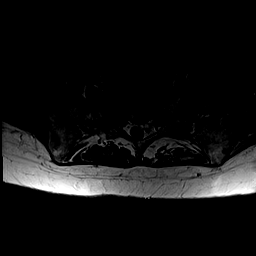
[im 3/33]
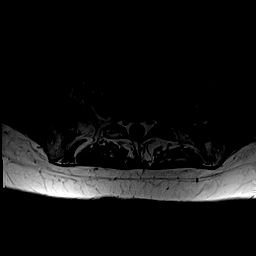
[im 5/33]
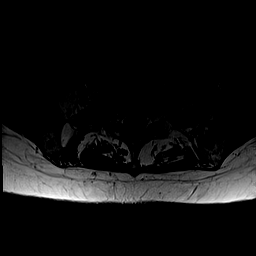
[im 7/33]
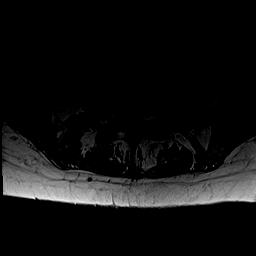
[im 10/33]
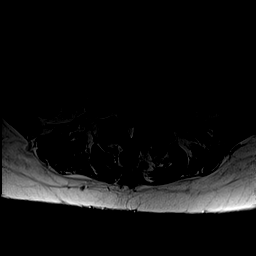
[im 14/33]
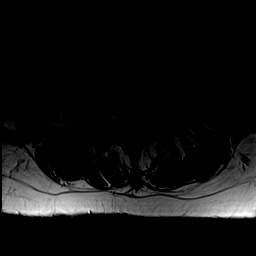
[im 17/33]
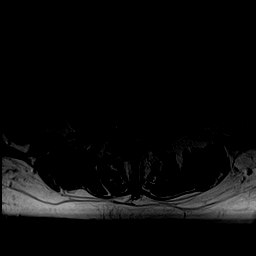
[im 19/33]
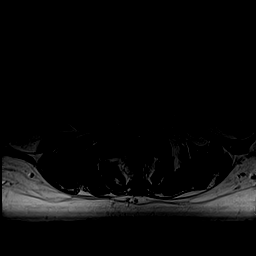
[im 23/33]
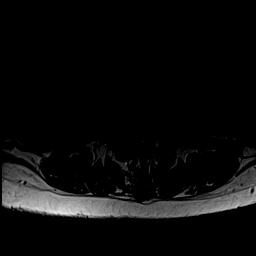
[im 28/33]
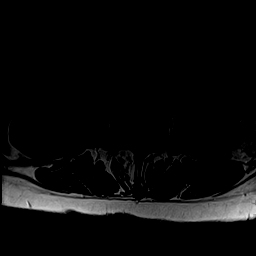
[im 33/33]
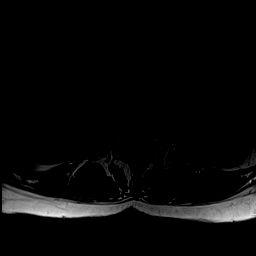

[44 of 48 positions shown; findings below may reference images not displayed]

FINDINGS: Segmentation: Transitional S1 segment is present. The last fully
formed vertebral body is labeled S1

Alignment: Slight retrolisthesis present at L2-3. Minimal
anterolisthesis is present at L3-4 and L4-5. Levoconvex curvature is
centered at L2-3. Rightward curvature is centered at L4-5.

Vertebrae: Chronic edematous endplate marrow changes are present at
L3-4. Edematous endplate changes are present on the right at L2-3.
Edematous endplate changes are present on the left at L5-S1.
Heterogeneous marrow signal is present. Hemangioma is present at
T12.

Conus medullaris and cauda equina: Conus extends to the L1 level.
Conus and cauda equina appear normal.

Paraspinal and other soft tissues: Limited imaging the abdomen is
unremarkable. There is no significant adenopathy. No solid organ
lesions are present.

Disc levels:

L1-2: Negative.

L2-3: Mild disc bulging and facet hypertrophy is present. Mild right
foraminal narrowing is present.

L3-4: A broad-based disc protrusion is present. Moderate facet
hypertrophy is noted bilaterally. Moderate foraminal stenosis is
present bilaterally. The central canal is patent.

L4-5: A broad-based disc protrusion is present. Moderate facet
hypertrophy is noted bilaterally. The central canal is patent.
Foramina are patent bilaterally.

L5-S1: Chronic loss of disc height is present. Leftward disc
protrusion extends into the foramen. The central canal is patent.
Moderate left foraminal stenosis is present.

Rudimentary disc is present at S1-2 without significant stenosis.
IMPRESSION: 1. Transitional anatomy. The last fully formed vertebral body is
labeled S1.
2. Moderate left foraminal stenosis at L5-S1 secondary to a leftward
disc protrusion. This is the most significant left-sided disease.
3. Moderate foraminal narrowing bilaterally at L3-4.
4. Mild right foraminal narrowing at L2-3.
5. Moderate facet hypertrophy at L4-5 without significant stenosis.

## 2020-01-28 DIAGNOSIS — M25552 Pain in left hip: Secondary | ICD-10-CM | POA: Diagnosis not present

## 2020-01-28 DIAGNOSIS — M545 Low back pain: Secondary | ICD-10-CM | POA: Diagnosis not present

## 2020-02-03 ENCOUNTER — Other Ambulatory Visit: Payer: Self-pay | Admitting: Orthopedic Surgery

## 2020-02-03 DIAGNOSIS — R69 Illness, unspecified: Secondary | ICD-10-CM | POA: Diagnosis not present

## 2020-02-03 DIAGNOSIS — M25551 Pain in right hip: Secondary | ICD-10-CM

## 2020-02-04 ENCOUNTER — Other Ambulatory Visit: Payer: Medicare HMO

## 2020-02-16 ENCOUNTER — Other Ambulatory Visit: Payer: Self-pay | Admitting: Orthopedic Surgery

## 2020-02-16 ENCOUNTER — Ambulatory Visit
Admission: RE | Admit: 2020-02-16 | Discharge: 2020-02-16 | Disposition: A | Discharge status: Home / Self Care | Payer: Medicare HMO | Source: Ambulatory Visit | Attending: Orthopedic Surgery | Admitting: Orthopedic Surgery

## 2020-02-16 DIAGNOSIS — M76892 Other specified enthesopathies of left lower limb, excluding foot: Secondary | ICD-10-CM | POA: Diagnosis not present

## 2020-02-16 DIAGNOSIS — M65852 Other synovitis and tenosynovitis, left thigh: Secondary | ICD-10-CM | POA: Diagnosis not present

## 2020-02-16 DIAGNOSIS — M25552 Pain in left hip: Secondary | ICD-10-CM

## 2020-02-16 DIAGNOSIS — M25551 Pain in right hip: Secondary | ICD-10-CM

## 2020-02-16 DIAGNOSIS — M419 Scoliosis, unspecified: Secondary | ICD-10-CM | POA: Diagnosis not present

## 2020-02-16 DIAGNOSIS — M1612 Unilateral primary osteoarthritis, left hip: Secondary | ICD-10-CM | POA: Diagnosis not present

## 2020-02-16 IMAGING — MR MR HIP*L* W/O CM
5 series · 40 of 40 positions shown · non-contrast
Comparison: None.

CLINICAL DATA: Left hip pain

EXAM:
MR OF THE LEFT HIP WITHOUT CONTRAST
TECHNIQUE: Multiplanar, multisequence MR imaging was performed. No intravenous
contrast was administered.

[Series 4: T2 fat-sat · coronal · 4.0mm · 1.19mm/px · 9 of 31 slices shown (1 of 2)]
[im 1/31]
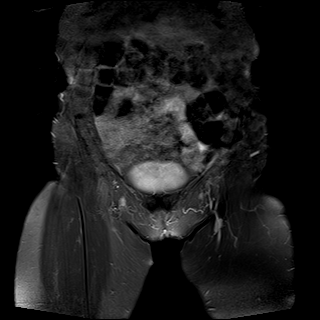
[im 4/31]
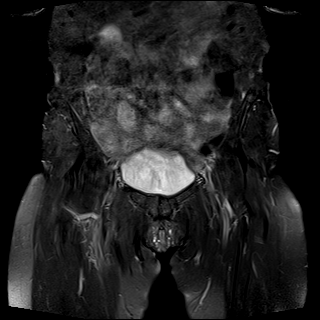
[im 8/31]
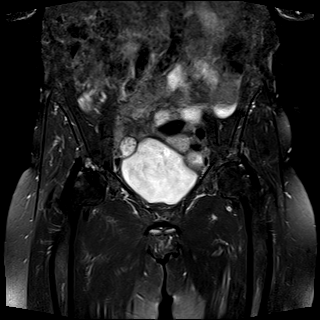
[im 12/31]
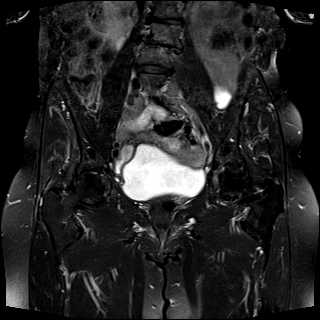
[im 16/31]
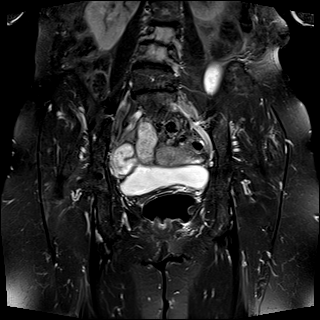
[im 19/31]
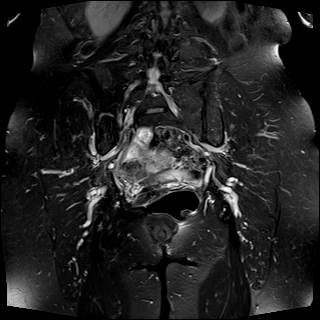
[im 23/31]
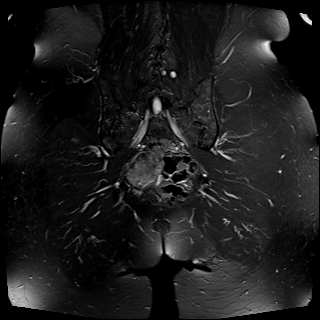
[im 27/31]
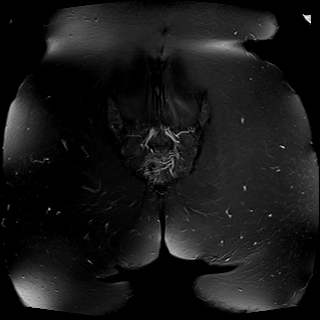
[im 31/31]
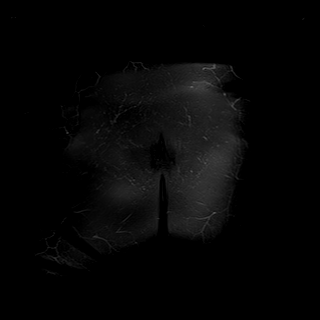

[Series 5: T1 · coronal · 4.0mm · 1.19mm/px · 9 of 31 slices shown]
[im 1/31]
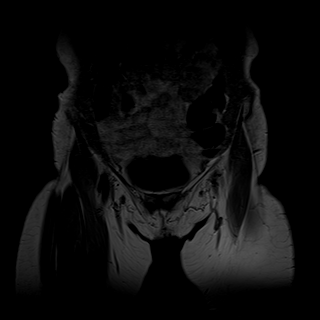
[im 4/31]
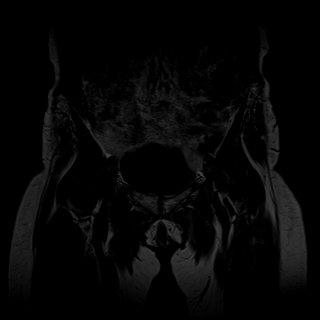
[im 8/31]
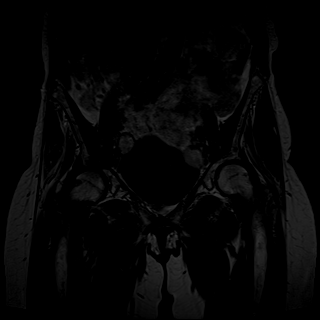
[im 12/31]
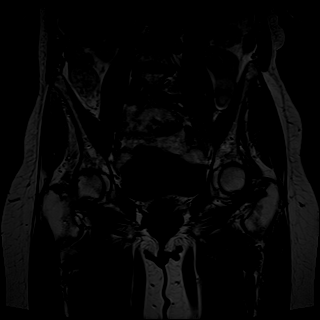
[im 16/31]
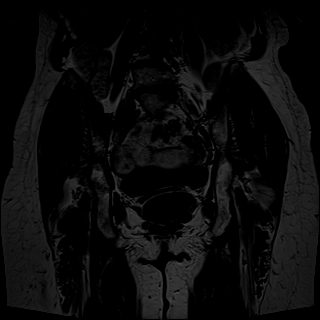
[im 19/31]
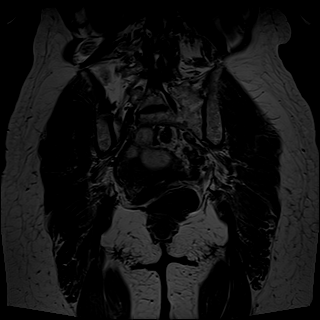
[im 23/31]
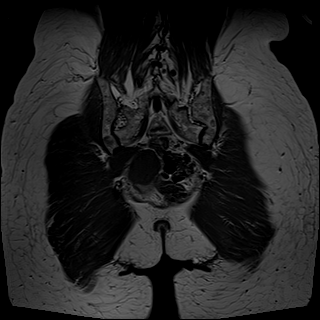
[im 27/31]
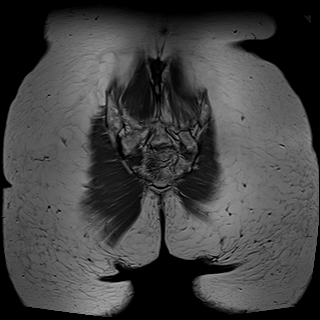
[im 31/31]
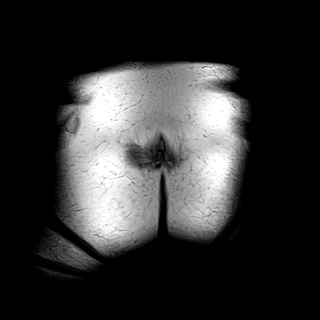

[Series 6: T2 fat-sat · axial · 4.0mm · 0.35mm/px · z∈[-101,+34]mm · 8 of 28 slices shown (2 of 2)]
[im 1/28]
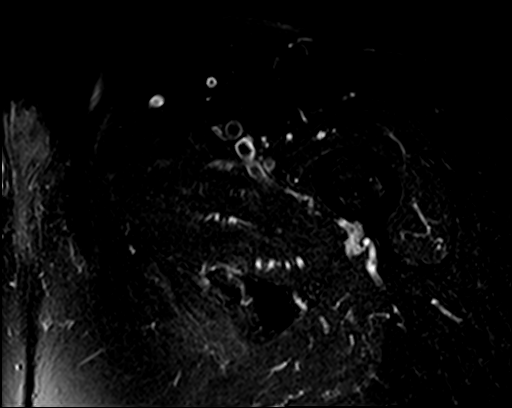
[im 4/28]
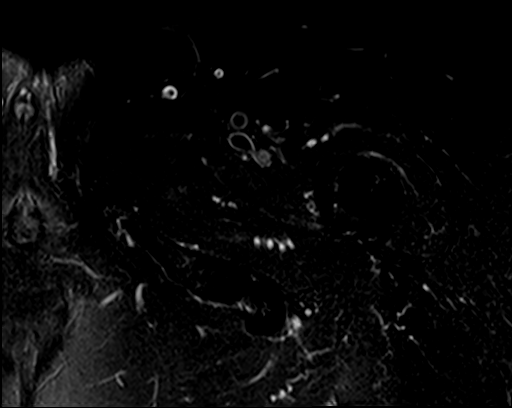
[im 8/28]
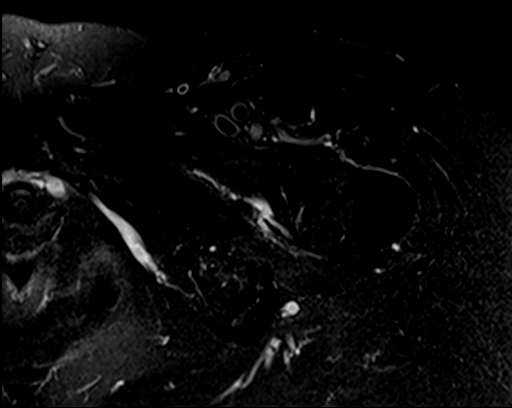
[im 12/28]
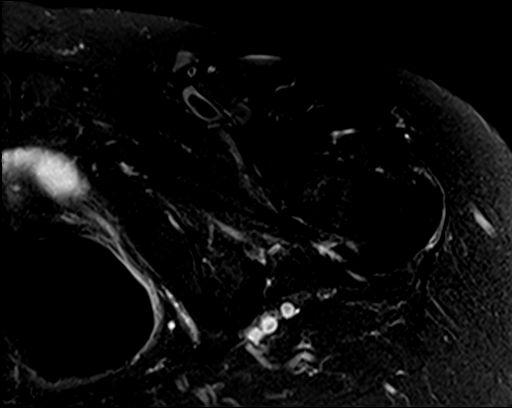
[im 16/28]
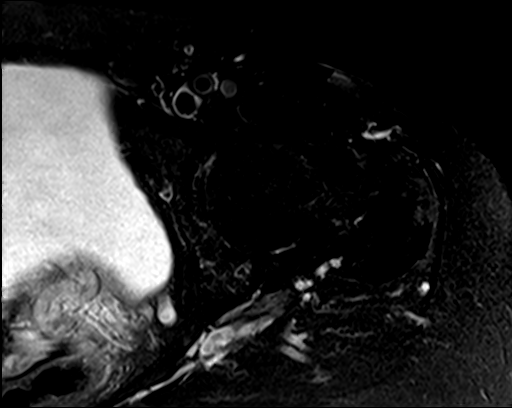
[im 20/28]
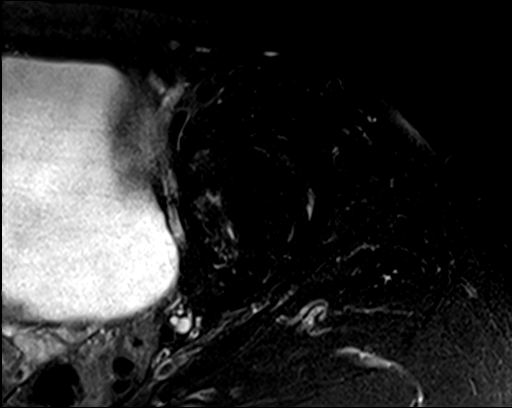
[im 24/28]
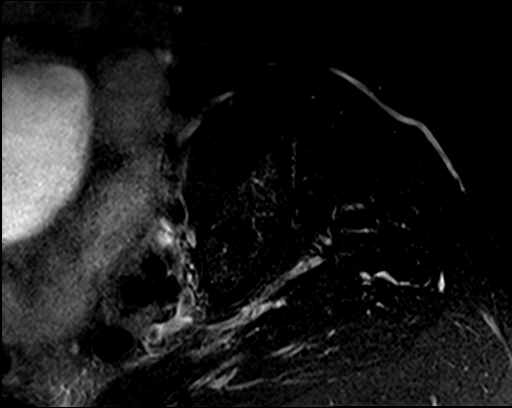
[im 28/28]
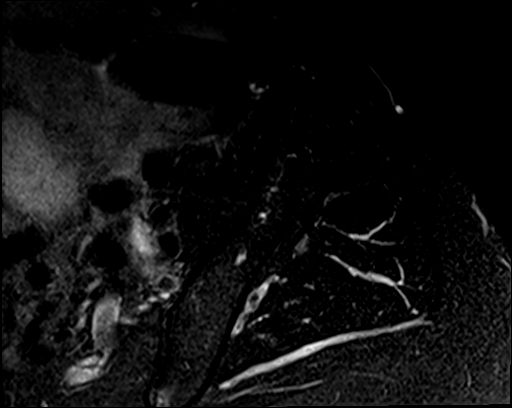

[Series 7: PD fat-sat · sagittal · 4.0mm · 0.70mm/px · 8 of 26 slices shown (1 of 2)]
[im 1/26]
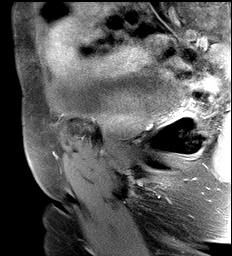
[im 4/26]
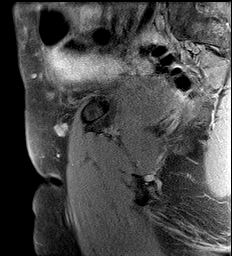
[im 8/26]
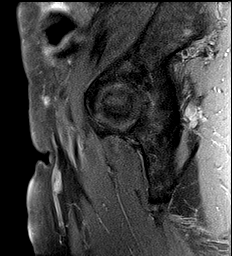
[im 11/26]
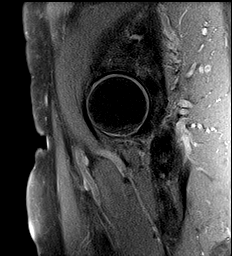
[im 15/26]
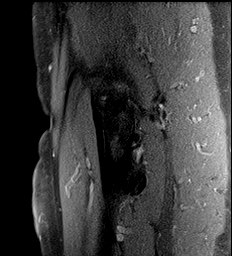
[im 18/26]
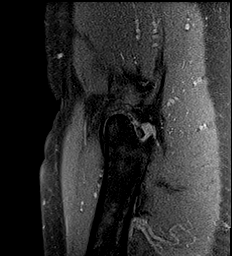
[im 22/26]
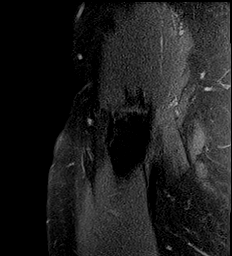
[im 26/26]
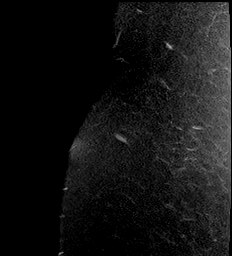

[Series 8: PD fat-sat · coronal · 4.0mm · 0.70mm/px · 6 of 19 slices shown (2 of 2)]
[im 1/19]
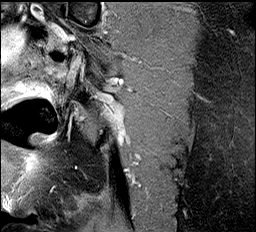
[im 4/19]
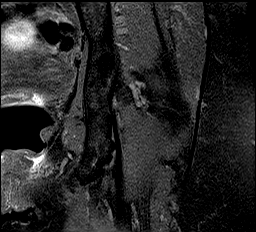
[im 8/19]
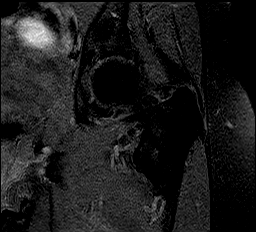
[im 11/19]
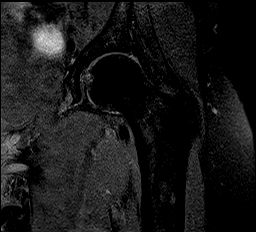
[im 15/19]
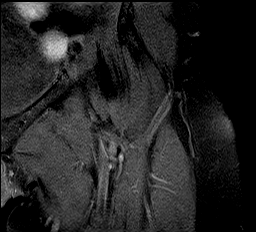
[im 19/19]
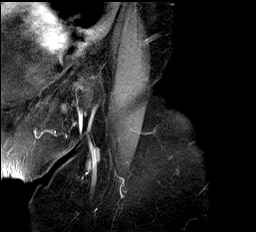

[40 of 40 positions shown; findings below may reference images not displayed]

FINDINGS: Both hips are normally located. Moderate right and mild left hip
joint degenerative changes. No stress fracture or AVN. No hip joint
effusion or periarticular fluid collections to suggest a paralabral
cyst. Mild bilateral peritendinitis but no findings for trochanteric
bursitis.

Areas of degenerative chondrosis and mild/moderate labral
degenerative changes but no overt tear.

The pubic symphysis and SI joints are intact. No pelvic fractures or
bone lesions.

Scoliosis and advanced degenerative lumbar spondylosis with
multilevel disc disease and facet disease.

No significant intrapelvic abnormalities.
IMPRESSION: 1. Moderate right and mild left hip joint degenerative changes but
no stress fracture or AVN.
2. Mild bilateral peritendinitis but no findings for trochanteric
bursitis.
3. No significant intrapelvic abnormalities.
4. Scoliosis and advanced degenerative lumbar spondylosis with
multilevel disc disease and facet disease.

## 2020-02-19 DIAGNOSIS — M545 Low back pain: Secondary | ICD-10-CM | POA: Diagnosis not present

## 2020-02-23 ENCOUNTER — Other Ambulatory Visit: Payer: Medicare HMO

## 2020-02-25 DIAGNOSIS — Z6823 Body mass index (BMI) 23.0-23.9, adult: Secondary | ICD-10-CM | POA: Diagnosis not present

## 2020-02-25 DIAGNOSIS — N952 Postmenopausal atrophic vaginitis: Secondary | ICD-10-CM | POA: Diagnosis not present

## 2020-02-25 DIAGNOSIS — Z124 Encounter for screening for malignant neoplasm of cervix: Secondary | ICD-10-CM | POA: Diagnosis not present

## 2020-02-25 DIAGNOSIS — N3289 Other specified disorders of bladder: Secondary | ICD-10-CM | POA: Diagnosis not present

## 2020-02-25 DIAGNOSIS — M8580 Other specified disorders of bone density and structure, unspecified site: Secondary | ICD-10-CM | POA: Diagnosis not present

## 2020-03-03 DIAGNOSIS — M4180 Other forms of scoliosis, site unspecified: Secondary | ICD-10-CM | POA: Diagnosis not present

## 2020-03-03 DIAGNOSIS — M545 Low back pain: Secondary | ICD-10-CM | POA: Diagnosis not present

## 2020-03-10 DIAGNOSIS — M545 Low back pain: Secondary | ICD-10-CM | POA: Diagnosis not present

## 2020-03-10 DIAGNOSIS — M6281 Muscle weakness (generalized): Secondary | ICD-10-CM | POA: Diagnosis not present

## 2020-03-16 DIAGNOSIS — M6281 Muscle weakness (generalized): Secondary | ICD-10-CM | POA: Diagnosis not present

## 2020-03-16 DIAGNOSIS — M545 Low back pain: Secondary | ICD-10-CM | POA: Diagnosis not present

## 2020-03-23 DIAGNOSIS — M545 Low back pain: Secondary | ICD-10-CM | POA: Diagnosis not present

## 2020-03-23 DIAGNOSIS — M6281 Muscle weakness (generalized): Secondary | ICD-10-CM | POA: Diagnosis not present

## 2020-03-24 DIAGNOSIS — R69 Illness, unspecified: Secondary | ICD-10-CM | POA: Diagnosis not present

## 2020-03-25 DIAGNOSIS — M6281 Muscle weakness (generalized): Secondary | ICD-10-CM | POA: Diagnosis not present

## 2020-03-25 DIAGNOSIS — M545 Low back pain: Secondary | ICD-10-CM | POA: Diagnosis not present

## 2020-03-30 DIAGNOSIS — M6281 Muscle weakness (generalized): Secondary | ICD-10-CM | POA: Diagnosis not present

## 2020-03-30 DIAGNOSIS — M545 Low back pain: Secondary | ICD-10-CM | POA: Diagnosis not present

## 2020-04-01 DIAGNOSIS — M6281 Muscle weakness (generalized): Secondary | ICD-10-CM | POA: Diagnosis not present

## 2020-04-01 DIAGNOSIS — M545 Low back pain: Secondary | ICD-10-CM | POA: Diagnosis not present

## 2020-04-13 DIAGNOSIS — M6281 Muscle weakness (generalized): Secondary | ICD-10-CM | POA: Diagnosis not present

## 2020-04-13 DIAGNOSIS — M545 Low back pain, unspecified: Secondary | ICD-10-CM | POA: Diagnosis not present

## 2020-04-14 DIAGNOSIS — Z23 Encounter for immunization: Secondary | ICD-10-CM | POA: Diagnosis not present

## 2020-04-15 DIAGNOSIS — M6281 Muscle weakness (generalized): Secondary | ICD-10-CM | POA: Diagnosis not present

## 2020-04-15 DIAGNOSIS — M545 Low back pain, unspecified: Secondary | ICD-10-CM | POA: Diagnosis not present

## 2020-04-20 DIAGNOSIS — M6281 Muscle weakness (generalized): Secondary | ICD-10-CM | POA: Diagnosis not present

## 2020-04-20 DIAGNOSIS — M545 Low back pain, unspecified: Secondary | ICD-10-CM | POA: Diagnosis not present

## 2020-04-22 DIAGNOSIS — M545 Low back pain, unspecified: Secondary | ICD-10-CM | POA: Diagnosis not present

## 2020-04-22 DIAGNOSIS — M6281 Muscle weakness (generalized): Secondary | ICD-10-CM | POA: Diagnosis not present

## 2020-04-27 DIAGNOSIS — M545 Low back pain, unspecified: Secondary | ICD-10-CM | POA: Diagnosis not present

## 2020-04-27 DIAGNOSIS — M6281 Muscle weakness (generalized): Secondary | ICD-10-CM | POA: Diagnosis not present

## 2020-04-29 DIAGNOSIS — M545 Low back pain, unspecified: Secondary | ICD-10-CM | POA: Diagnosis not present

## 2020-04-29 DIAGNOSIS — M6281 Muscle weakness (generalized): Secondary | ICD-10-CM | POA: Diagnosis not present

## 2020-05-04 DIAGNOSIS — M545 Low back pain, unspecified: Secondary | ICD-10-CM | POA: Diagnosis not present

## 2020-05-04 DIAGNOSIS — M6281 Muscle weakness (generalized): Secondary | ICD-10-CM | POA: Diagnosis not present

## 2020-05-05 DIAGNOSIS — J019 Acute sinusitis, unspecified: Secondary | ICD-10-CM | POA: Diagnosis not present

## 2020-05-06 DIAGNOSIS — M6281 Muscle weakness (generalized): Secondary | ICD-10-CM | POA: Diagnosis not present

## 2020-05-06 DIAGNOSIS — M545 Low back pain, unspecified: Secondary | ICD-10-CM | POA: Diagnosis not present

## 2020-05-11 DIAGNOSIS — M6281 Muscle weakness (generalized): Secondary | ICD-10-CM | POA: Diagnosis not present

## 2020-05-11 DIAGNOSIS — M545 Low back pain, unspecified: Secondary | ICD-10-CM | POA: Diagnosis not present

## 2020-05-13 DIAGNOSIS — M6281 Muscle weakness (generalized): Secondary | ICD-10-CM | POA: Diagnosis not present

## 2020-05-13 DIAGNOSIS — M545 Low back pain, unspecified: Secondary | ICD-10-CM | POA: Diagnosis not present

## 2020-05-18 DIAGNOSIS — M545 Low back pain, unspecified: Secondary | ICD-10-CM | POA: Diagnosis not present

## 2020-05-18 DIAGNOSIS — M6281 Muscle weakness (generalized): Secondary | ICD-10-CM | POA: Diagnosis not present

## 2020-05-20 DIAGNOSIS — M545 Low back pain, unspecified: Secondary | ICD-10-CM | POA: Diagnosis not present

## 2020-05-20 DIAGNOSIS — M6281 Muscle weakness (generalized): Secondary | ICD-10-CM | POA: Diagnosis not present

## 2020-06-01 DIAGNOSIS — M6281 Muscle weakness (generalized): Secondary | ICD-10-CM | POA: Diagnosis not present

## 2020-06-01 DIAGNOSIS — M545 Low back pain, unspecified: Secondary | ICD-10-CM | POA: Diagnosis not present

## 2020-06-03 DIAGNOSIS — M545 Low back pain, unspecified: Secondary | ICD-10-CM | POA: Diagnosis not present

## 2020-06-03 DIAGNOSIS — M6281 Muscle weakness (generalized): Secondary | ICD-10-CM | POA: Diagnosis not present

## 2020-06-05 DIAGNOSIS — E7800 Pure hypercholesterolemia, unspecified: Secondary | ICD-10-CM | POA: Diagnosis not present

## 2020-06-05 DIAGNOSIS — M1990 Unspecified osteoarthritis, unspecified site: Secondary | ICD-10-CM | POA: Diagnosis not present

## 2020-06-05 DIAGNOSIS — K760 Fatty (change of) liver, not elsewhere classified: Secondary | ICD-10-CM | POA: Diagnosis not present

## 2020-06-05 DIAGNOSIS — M810 Age-related osteoporosis without current pathological fracture: Secondary | ICD-10-CM | POA: Diagnosis not present

## 2020-06-05 DIAGNOSIS — R7303 Prediabetes: Secondary | ICD-10-CM | POA: Diagnosis not present

## 2020-06-05 DIAGNOSIS — Z0000 Encounter for general adult medical examination without abnormal findings: Secondary | ICD-10-CM | POA: Diagnosis not present

## 2020-06-05 DIAGNOSIS — J302 Other seasonal allergic rhinitis: Secondary | ICD-10-CM | POA: Diagnosis not present

## 2020-06-05 DIAGNOSIS — I700 Atherosclerosis of aorta: Secondary | ICD-10-CM | POA: Diagnosis not present

## 2020-06-05 DIAGNOSIS — K8020 Calculus of gallbladder without cholecystitis without obstruction: Secondary | ICD-10-CM | POA: Diagnosis not present

## 2020-06-05 DIAGNOSIS — I2510 Atherosclerotic heart disease of native coronary artery without angina pectoris: Secondary | ICD-10-CM | POA: Diagnosis not present

## 2020-06-08 DIAGNOSIS — M6281 Muscle weakness (generalized): Secondary | ICD-10-CM | POA: Diagnosis not present

## 2020-06-08 DIAGNOSIS — M545 Low back pain, unspecified: Secondary | ICD-10-CM | POA: Diagnosis not present

## 2020-06-10 DIAGNOSIS — M6281 Muscle weakness (generalized): Secondary | ICD-10-CM | POA: Diagnosis not present

## 2020-06-10 DIAGNOSIS — M545 Low back pain, unspecified: Secondary | ICD-10-CM | POA: Diagnosis not present

## 2020-06-22 DIAGNOSIS — M545 Low back pain, unspecified: Secondary | ICD-10-CM | POA: Diagnosis not present

## 2020-06-22 DIAGNOSIS — M6281 Muscle weakness (generalized): Secondary | ICD-10-CM | POA: Diagnosis not present

## 2020-06-24 DIAGNOSIS — M6281 Muscle weakness (generalized): Secondary | ICD-10-CM | POA: Diagnosis not present

## 2020-06-24 DIAGNOSIS — M545 Low back pain, unspecified: Secondary | ICD-10-CM | POA: Diagnosis not present

## 2020-06-25 DIAGNOSIS — M810 Age-related osteoporosis without current pathological fracture: Secondary | ICD-10-CM | POA: Diagnosis not present

## 2020-06-25 DIAGNOSIS — R7303 Prediabetes: Secondary | ICD-10-CM | POA: Diagnosis not present

## 2020-06-25 DIAGNOSIS — I700 Atherosclerosis of aorta: Secondary | ICD-10-CM | POA: Diagnosis not present

## 2020-06-25 DIAGNOSIS — Z79899 Other long term (current) drug therapy: Secondary | ICD-10-CM | POA: Diagnosis not present

## 2020-07-13 DIAGNOSIS — M6281 Muscle weakness (generalized): Secondary | ICD-10-CM | POA: Diagnosis not present

## 2020-07-13 DIAGNOSIS — M545 Low back pain, unspecified: Secondary | ICD-10-CM | POA: Diagnosis not present

## 2020-07-24 DIAGNOSIS — J029 Acute pharyngitis, unspecified: Secondary | ICD-10-CM | POA: Diagnosis not present

## 2020-07-24 DIAGNOSIS — R0982 Postnasal drip: Secondary | ICD-10-CM | POA: Diagnosis not present

## 2020-08-03 DIAGNOSIS — M545 Low back pain, unspecified: Secondary | ICD-10-CM | POA: Diagnosis not present

## 2020-08-03 DIAGNOSIS — M6281 Muscle weakness (generalized): Secondary | ICD-10-CM | POA: Diagnosis not present

## 2020-08-07 DIAGNOSIS — S96911A Strain of unspecified muscle and tendon at ankle and foot level, right foot, initial encounter: Secondary | ICD-10-CM | POA: Diagnosis not present

## 2020-08-13 DIAGNOSIS — M810 Age-related osteoporosis without current pathological fracture: Secondary | ICD-10-CM | POA: Diagnosis not present

## 2020-08-24 DIAGNOSIS — M6281 Muscle weakness (generalized): Secondary | ICD-10-CM | POA: Diagnosis not present

## 2020-08-24 DIAGNOSIS — M545 Low back pain, unspecified: Secondary | ICD-10-CM | POA: Diagnosis not present

## 2020-08-31 DIAGNOSIS — M545 Low back pain, unspecified: Secondary | ICD-10-CM | POA: Diagnosis not present

## 2020-08-31 DIAGNOSIS — M6281 Muscle weakness (generalized): Secondary | ICD-10-CM | POA: Diagnosis not present

## 2020-10-15 ENCOUNTER — Other Ambulatory Visit: Payer: Self-pay

## 2020-10-15 ENCOUNTER — Encounter: Payer: Self-pay | Admitting: Podiatry

## 2020-10-15 ENCOUNTER — Ambulatory Visit (INDEPENDENT_AMBULATORY_CARE_PROVIDER_SITE_OTHER): Payer: Medicare HMO | Admitting: Podiatry

## 2020-10-15 ENCOUNTER — Ambulatory Visit (INDEPENDENT_AMBULATORY_CARE_PROVIDER_SITE_OTHER): Payer: Medicare HMO

## 2020-10-15 DIAGNOSIS — M2142 Flat foot [pes planus] (acquired), left foot: Secondary | ICD-10-CM

## 2020-10-15 DIAGNOSIS — M778 Other enthesopathies, not elsewhere classified: Secondary | ICD-10-CM

## 2020-10-15 DIAGNOSIS — M2141 Flat foot [pes planus] (acquired), right foot: Secondary | ICD-10-CM | POA: Diagnosis not present

## 2020-10-15 MED ORDER — TRIAMCINOLONE ACETONIDE 10 MG/ML IJ SUSP
10.0000 mg | Freq: Once | INTRAMUSCULAR | Status: AC
  Administered 2020-10-15: 10 mg

## 2020-10-19 NOTE — Progress Notes (Signed)
Subjective:   Patient ID: Carla Kelley, female   DOB: 70 y.o.   MRN: [id number]   HPI Patient states she developed a lot of pain in her right ankle and its been inflamed and she does not remember specific injury and states its been hurting for several months   ROS      Objective:  Physical Exam  Neurovascular status intact with inflammation pain of the sinus tarsi right fluid buildup within the joint with pain upon palpation     Assessment:  Acute sinus tarsitis right with inflammation fluid buildup     Plan:  Reviewed condition x-ray and today I did sterile prep and injected the sinus tarsi 3 mg Kenalog 5 mg Xylocaine and instructed on anti-inflammatories and supportive shoe gear usage.  Reappoint as symptoms indicate  X-rays indicate no signs of fracture or bone injury associated with condition

## 2020-11-10 DIAGNOSIS — M1990 Unspecified osteoarthritis, unspecified site: Secondary | ICD-10-CM | POA: Diagnosis not present

## 2020-11-10 DIAGNOSIS — G5603 Carpal tunnel syndrome, bilateral upper limbs: Secondary | ICD-10-CM | POA: Diagnosis not present

## 2020-11-10 DIAGNOSIS — M810 Age-related osteoporosis without current pathological fracture: Secondary | ICD-10-CM | POA: Diagnosis not present

## 2020-11-12 DIAGNOSIS — J302 Other seasonal allergic rhinitis: Secondary | ICD-10-CM | POA: Insufficient documentation

## 2020-11-12 DIAGNOSIS — R209 Unspecified disturbances of skin sensation: Secondary | ICD-10-CM | POA: Insufficient documentation

## 2020-11-12 DIAGNOSIS — Q619 Cystic kidney disease, unspecified: Secondary | ICD-10-CM | POA: Insufficient documentation

## 2020-11-12 DIAGNOSIS — R7303 Prediabetes: Secondary | ICD-10-CM | POA: Insufficient documentation

## 2020-11-12 DIAGNOSIS — I700 Atherosclerosis of aorta: Secondary | ICD-10-CM | POA: Insufficient documentation

## 2020-11-12 DIAGNOSIS — R7401 Elevation of levels of liver transaminase levels: Secondary | ICD-10-CM | POA: Insufficient documentation

## 2020-11-12 DIAGNOSIS — R197 Diarrhea, unspecified: Secondary | ICD-10-CM | POA: Insufficient documentation

## 2020-11-12 DIAGNOSIS — K760 Fatty (change of) liver, not elsewhere classified: Secondary | ICD-10-CM | POA: Insufficient documentation

## 2020-11-12 DIAGNOSIS — G479 Sleep disorder, unspecified: Secondary | ICD-10-CM | POA: Insufficient documentation

## 2020-11-12 DIAGNOSIS — G5603 Carpal tunnel syndrome, bilateral upper limbs: Secondary | ICD-10-CM | POA: Diagnosis not present

## 2020-11-12 DIAGNOSIS — R748 Abnormal levels of other serum enzymes: Secondary | ICD-10-CM | POA: Insufficient documentation

## 2020-11-12 DIAGNOSIS — G4700 Insomnia, unspecified: Secondary | ICD-10-CM | POA: Insufficient documentation

## 2020-11-12 DIAGNOSIS — J309 Allergic rhinitis, unspecified: Secondary | ICD-10-CM | POA: Insufficient documentation

## 2020-11-12 DIAGNOSIS — N200 Calculus of kidney: Secondary | ICD-10-CM | POA: Insufficient documentation

## 2020-11-12 DIAGNOSIS — I2510 Atherosclerotic heart disease of native coronary artery without angina pectoris: Secondary | ICD-10-CM | POA: Insufficient documentation

## 2020-11-12 DIAGNOSIS — K8020 Calculus of gallbladder without cholecystitis without obstruction: Secondary | ICD-10-CM | POA: Insufficient documentation

## 2020-11-12 DIAGNOSIS — R252 Cramp and spasm: Secondary | ICD-10-CM | POA: Insufficient documentation

## 2020-11-26 ENCOUNTER — Other Ambulatory Visit: Payer: Self-pay | Admitting: Family Medicine

## 2020-11-26 DIAGNOSIS — Z1231 Encounter for screening mammogram for malignant neoplasm of breast: Secondary | ICD-10-CM

## 2020-12-01 ENCOUNTER — Ambulatory Visit: Payer: Medicare HMO

## 2020-12-02 DIAGNOSIS — M545 Low back pain, unspecified: Secondary | ICD-10-CM | POA: Diagnosis not present

## 2020-12-07 DIAGNOSIS — G5601 Carpal tunnel syndrome, right upper limb: Secondary | ICD-10-CM | POA: Diagnosis not present

## 2020-12-10 DIAGNOSIS — Z23 Encounter for immunization: Secondary | ICD-10-CM | POA: Diagnosis not present

## 2020-12-10 DIAGNOSIS — L03011 Cellulitis of right finger: Secondary | ICD-10-CM | POA: Diagnosis not present

## 2020-12-14 DIAGNOSIS — M6281 Muscle weakness (generalized): Secondary | ICD-10-CM | POA: Diagnosis not present

## 2020-12-14 DIAGNOSIS — M545 Low back pain, unspecified: Secondary | ICD-10-CM | POA: Diagnosis not present

## 2020-12-15 DIAGNOSIS — R102 Pelvic and perineal pain: Secondary | ICD-10-CM | POA: Diagnosis not present

## 2020-12-18 DIAGNOSIS — G5601 Carpal tunnel syndrome, right upper limb: Secondary | ICD-10-CM | POA: Diagnosis not present

## 2020-12-18 DIAGNOSIS — M25531 Pain in right wrist: Secondary | ICD-10-CM | POA: Diagnosis not present

## 2020-12-24 DIAGNOSIS — M545 Low back pain, unspecified: Secondary | ICD-10-CM | POA: Diagnosis not present

## 2020-12-24 DIAGNOSIS — M6281 Muscle weakness (generalized): Secondary | ICD-10-CM | POA: Diagnosis not present

## 2020-12-25 DIAGNOSIS — R102 Pelvic and perineal pain: Secondary | ICD-10-CM | POA: Diagnosis not present

## 2021-01-05 DIAGNOSIS — M545 Low back pain, unspecified: Secondary | ICD-10-CM | POA: Diagnosis not present

## 2021-01-05 DIAGNOSIS — M6281 Muscle weakness (generalized): Secondary | ICD-10-CM | POA: Diagnosis not present

## 2021-01-07 DIAGNOSIS — G5601 Carpal tunnel syndrome, right upper limb: Secondary | ICD-10-CM | POA: Diagnosis not present

## 2021-01-07 DIAGNOSIS — M13841 Other specified arthritis, right hand: Secondary | ICD-10-CM | POA: Diagnosis not present

## 2021-01-09 DIAGNOSIS — M13849 Other specified arthritis, unspecified hand: Secondary | ICD-10-CM | POA: Insufficient documentation

## 2021-01-11 DIAGNOSIS — M6281 Muscle weakness (generalized): Secondary | ICD-10-CM | POA: Diagnosis not present

## 2021-01-11 DIAGNOSIS — M545 Low back pain, unspecified: Secondary | ICD-10-CM | POA: Diagnosis not present

## 2021-01-18 ENCOUNTER — Encounter (HOSPITAL_BASED_OUTPATIENT_CLINIC_OR_DEPARTMENT_OTHER): Payer: Self-pay

## 2021-01-18 ENCOUNTER — Ambulatory Visit (HOSPITAL_BASED_OUTPATIENT_CLINIC_OR_DEPARTMENT_OTHER)
Admission: RE | Admit: 2021-01-18 | Discharge: 2021-01-18 | Disposition: A | Discharge status: Home / Self Care | Payer: Medicare HMO | Source: Ambulatory Visit | Attending: Family Medicine | Admitting: Family Medicine

## 2021-01-18 ENCOUNTER — Other Ambulatory Visit (HOSPITAL_BASED_OUTPATIENT_CLINIC_OR_DEPARTMENT_OTHER): Payer: Self-pay | Admitting: Family Medicine

## 2021-01-18 ENCOUNTER — Other Ambulatory Visit: Payer: Self-pay

## 2021-01-18 DIAGNOSIS — W19XXXA Unspecified fall, initial encounter: Secondary | ICD-10-CM | POA: Insufficient documentation

## 2021-01-18 DIAGNOSIS — Y939 Activity, unspecified: Secondary | ICD-10-CM | POA: Insufficient documentation

## 2021-01-18 DIAGNOSIS — R404 Transient alteration of awareness: Secondary | ICD-10-CM | POA: Diagnosis not present

## 2021-01-18 DIAGNOSIS — Y9352 Activity, horseback riding: Secondary | ICD-10-CM | POA: Diagnosis not present

## 2021-01-18 DIAGNOSIS — Y929 Unspecified place or not applicable: Secondary | ICD-10-CM | POA: Diagnosis not present

## 2021-01-18 DIAGNOSIS — R519 Headache, unspecified: Secondary | ICD-10-CM | POA: Diagnosis not present

## 2021-01-18 DIAGNOSIS — R42 Dizziness and giddiness: Secondary | ICD-10-CM | POA: Diagnosis not present

## 2021-01-18 IMAGING — CT CT HEAD W/O CM
4 series · 17 of 47 positions shown, 19 images · non-contrast
Comparison: None.

CLINICAL DATA: Headache since the patient was knocked over by horse
5 days ago.

EXAM:
CT HEAD WITHOUT CONTRAST
TECHNIQUE: Contiguous axial images were obtained from the base of the skull
through the vertex without intravenous contrast.

[Series 2: head wo · axial · 0.41mm/px · z∈[-587,-472]mm · 7 of 31 slices shown, 9 images]
[im 4/31  brain]
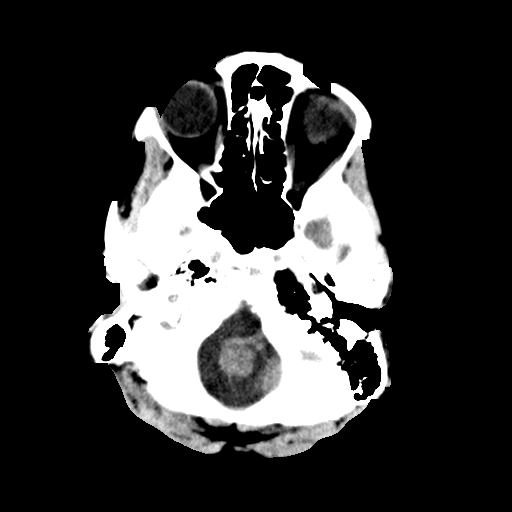
[im 4/31  bone]
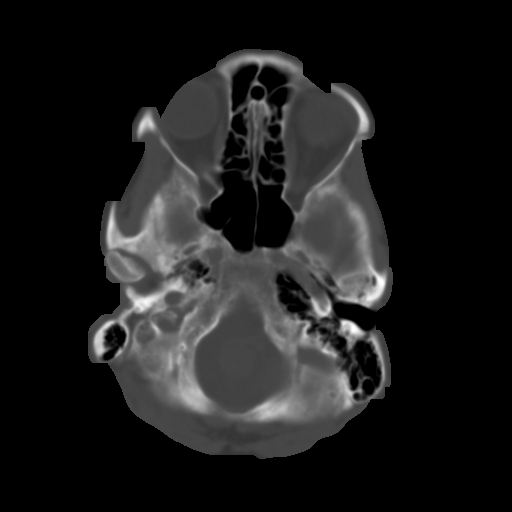
[im 8/31  brain]
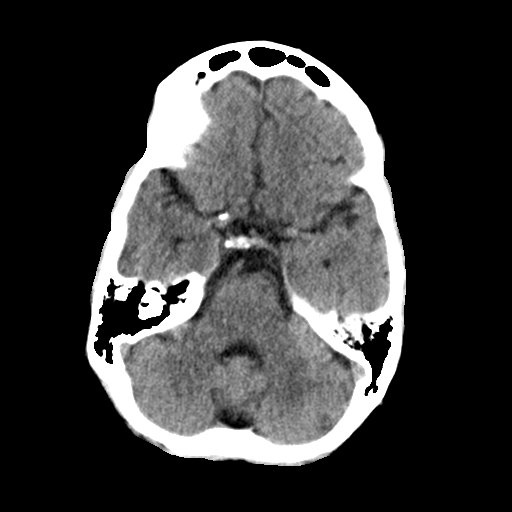
[im 12/31  brain]
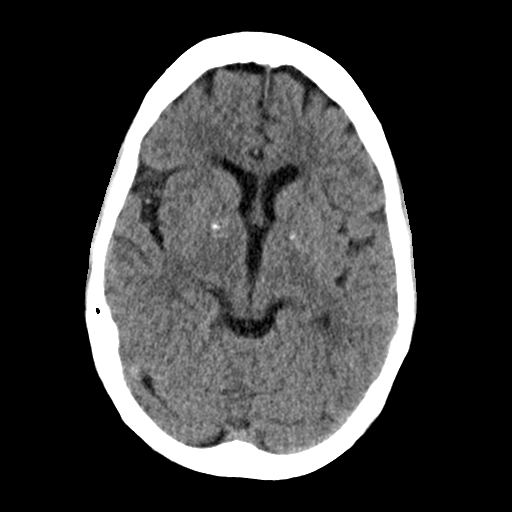
[im 16/31  brain]
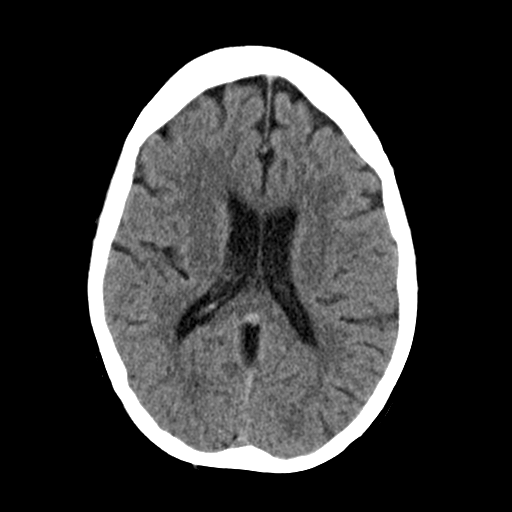
[im 19/31  brain]
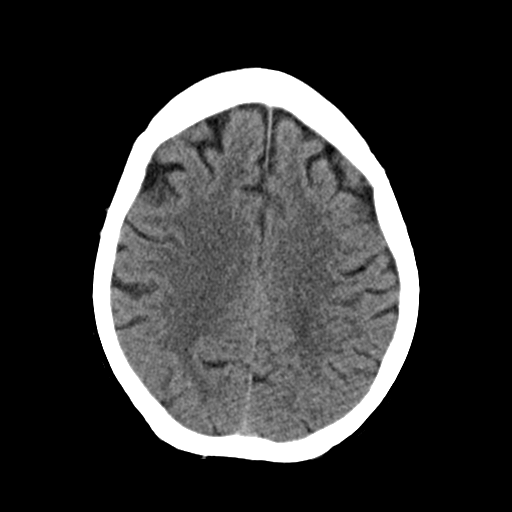
[im 19/31  bone]
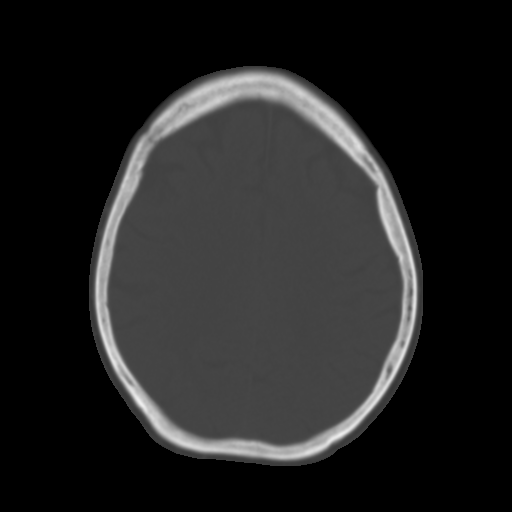
[im 23/31  brain]
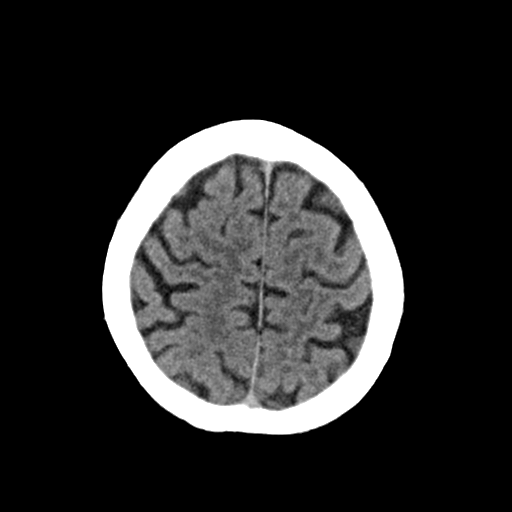
[im 27/31  brain]
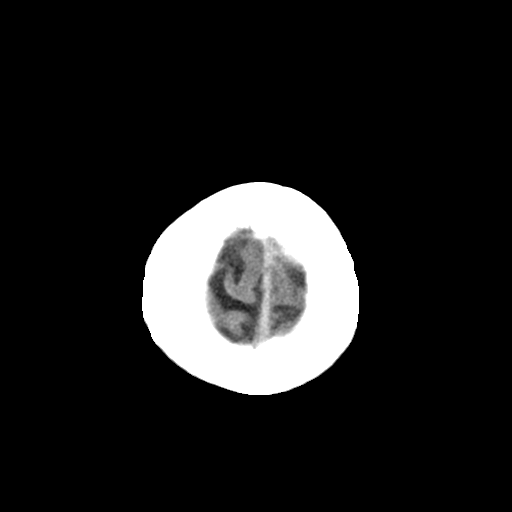

[Series 3: head bone · axial · 0.41mm/px · z∈[-588,-536]mm · 4 of 76 slices shown]
[im 8/76  bone]
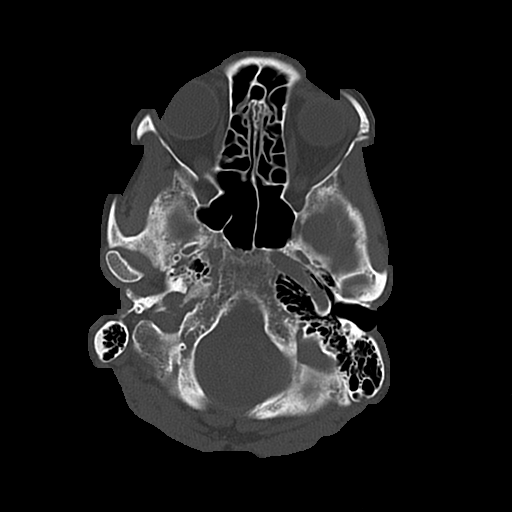
[im 16/76  bone]
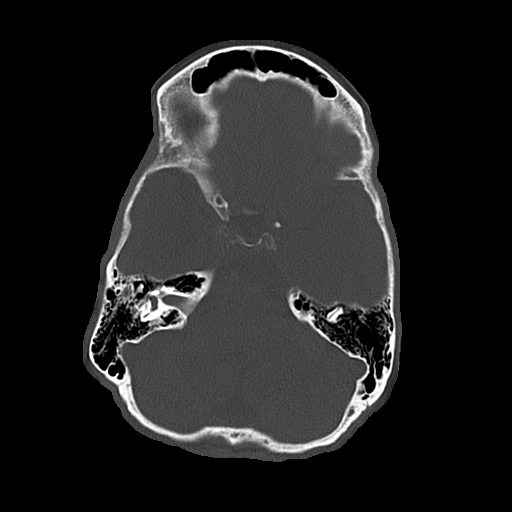
[im 23/76  bone]
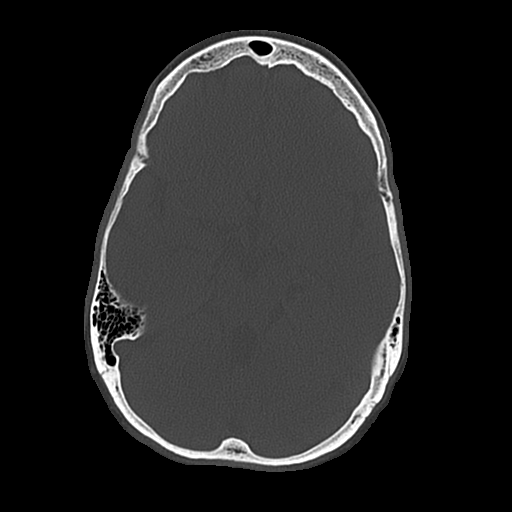
[im 34/76  bone]
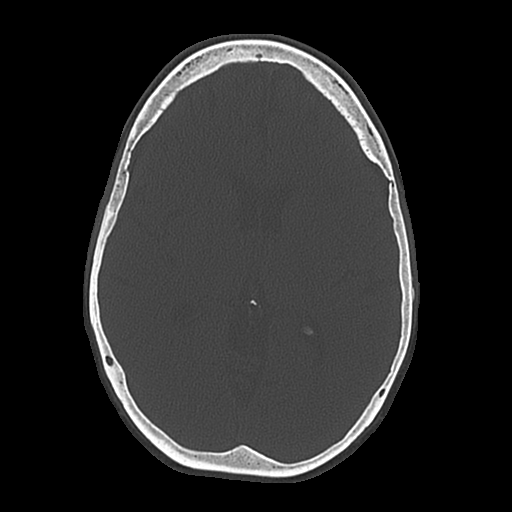

[Series 4: coronal soft · coronal · 0.30mm/px · 3 of 64 slices shown]
[im 22/64  brain]
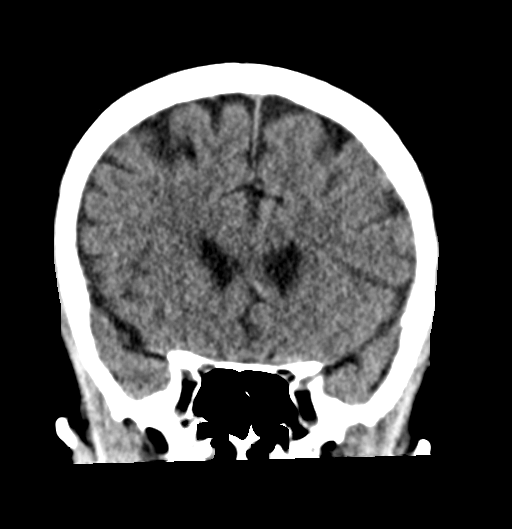
[im 29/64  brain]
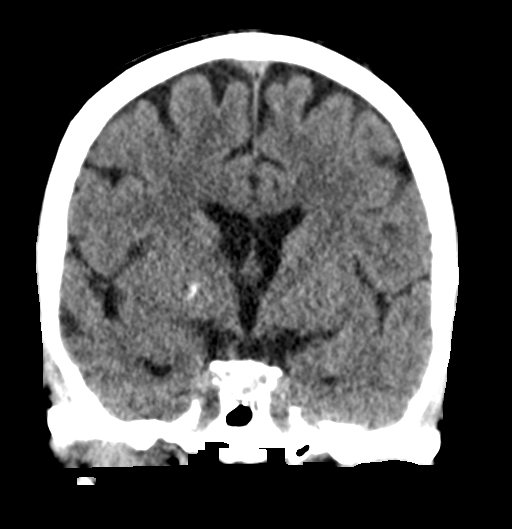
[im 36/64  brain]
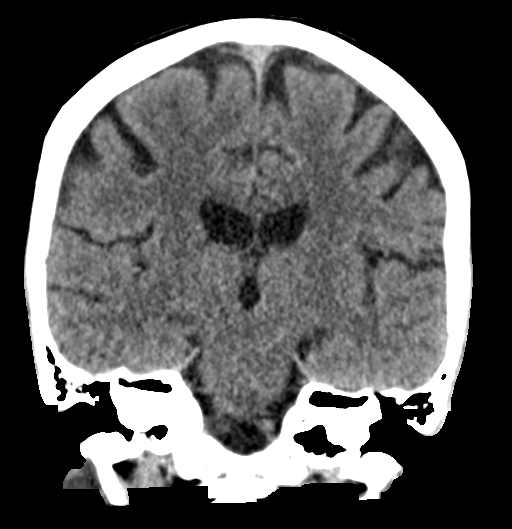

[Series 5: sagittal soft · sagittal · 0.31mm/px · 3 of 51 slices shown]
[im 17/51  brain]
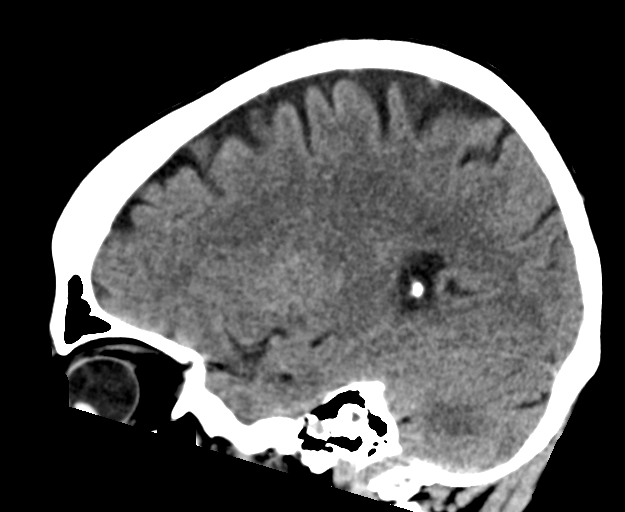
[im 26/51  brain]
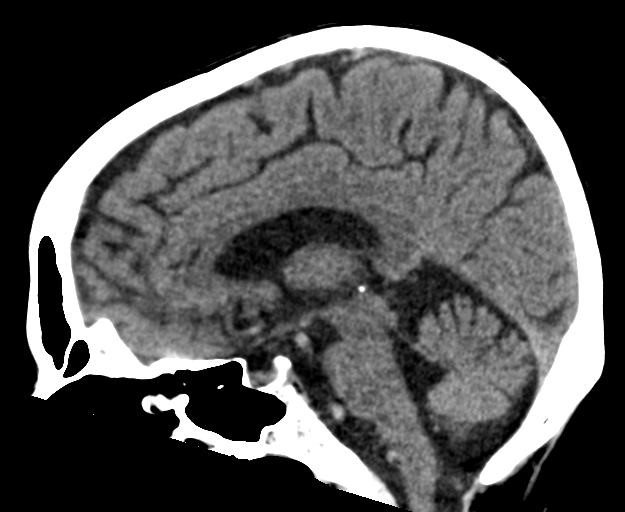
[im 34/51  brain]
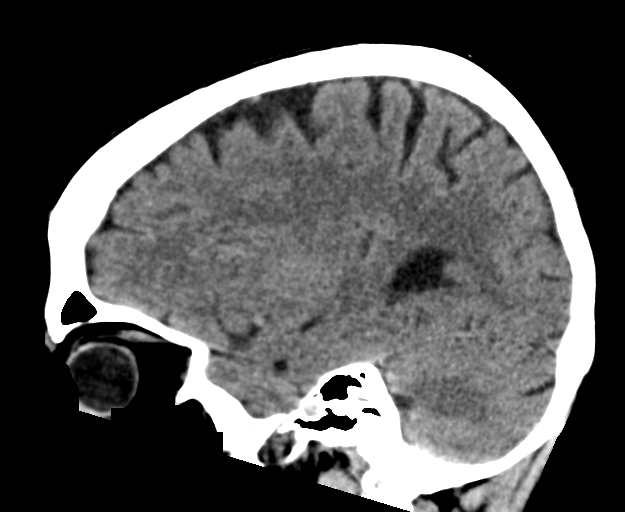

[17 of 47 positions shown; findings below may reference images not displayed]

FINDINGS: Brain: No evidence of acute infarction, hemorrhage, hydrocephalus,
extra-axial collection or mass lesion/mass effect.

Vascular: No hyperdense vessel or unexpected calcification.

Skull: Intact. No focal lesion. Congenital failure fusion of the
posterior arch of C1 is incidentally noted.

Sinuses/Orbits: Negative.

Other: None.
IMPRESSION: Negative head CT.

## 2021-01-28 ENCOUNTER — Other Ambulatory Visit: Payer: Self-pay | Admitting: Orthopedic Surgery

## 2021-01-28 DIAGNOSIS — S4991XA Unspecified injury of right shoulder and upper arm, initial encounter: Secondary | ICD-10-CM | POA: Diagnosis not present

## 2021-01-28 DIAGNOSIS — M25511 Pain in right shoulder: Secondary | ICD-10-CM | POA: Insufficient documentation

## 2021-01-31 ENCOUNTER — Ambulatory Visit
Admission: RE | Admit: 2021-01-31 | Discharge: 2021-01-31 | Disposition: A | Discharge status: Home / Self Care | Payer: Medicare HMO | Source: Ambulatory Visit | Attending: Orthopedic Surgery | Admitting: Orthopedic Surgery

## 2021-01-31 DIAGNOSIS — M25511 Pain in right shoulder: Secondary | ICD-10-CM | POA: Diagnosis not present

## 2021-01-31 IMAGING — MR MR SHOULDER*R* W/O CM
4 of 5 series · 22 of 40 positions shown · non-contrast
Comparison: None.

CLINICAL DATA: Right shoulder pain and decreased range of motion
for 2 weeks. No known injury.

EXAM:
MRI OF THE RIGHT SHOULDER WITHOUT CONTRAST
TECHNIQUE: Multiplanar, multisequence MR imaging of the shoulder was performed.
No intravenous contrast was administered.

[Series 6: T2 fat-sat · axial · right · 3.0mm · 0.47mm/px · z∈[-52,+48]mm · 8 of 27 slices shown (1 of 3)]
[im 1/27]
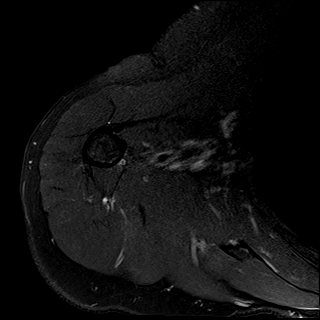
[im 3/27]
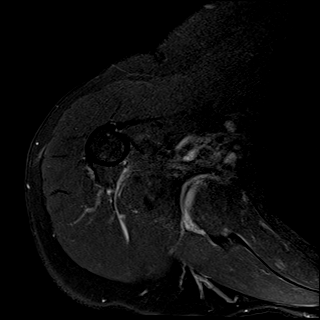
[im 9/27]
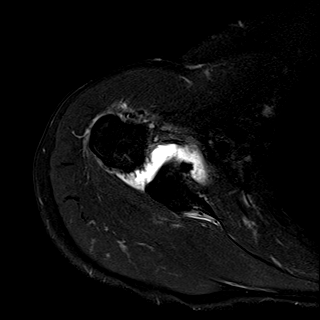
[im 12/27]
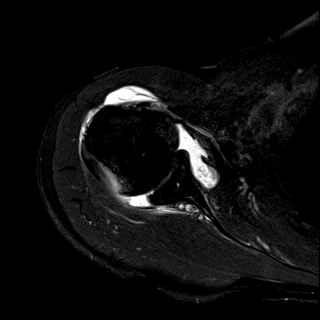
[im 15/27]
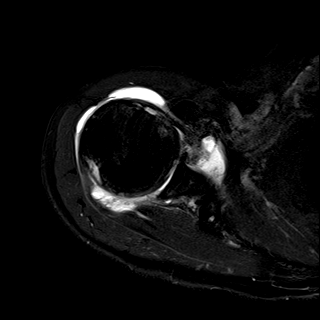
[im 18/27]
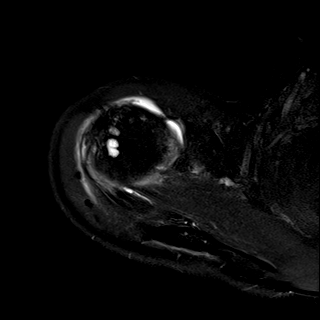
[im 24/27]
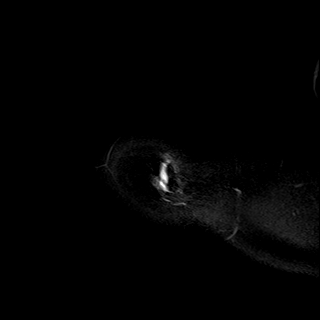
[im 27/27]
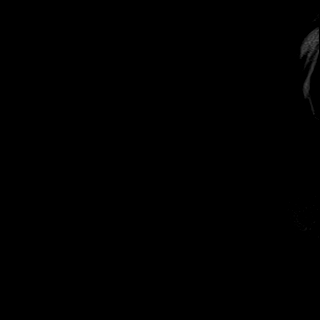

[Series 7: T2 fat-sat · sagittal · right · 4.0mm · 0.27mm/px · 4 of 21 slices shown (2 of 3)]
[im 1/21]
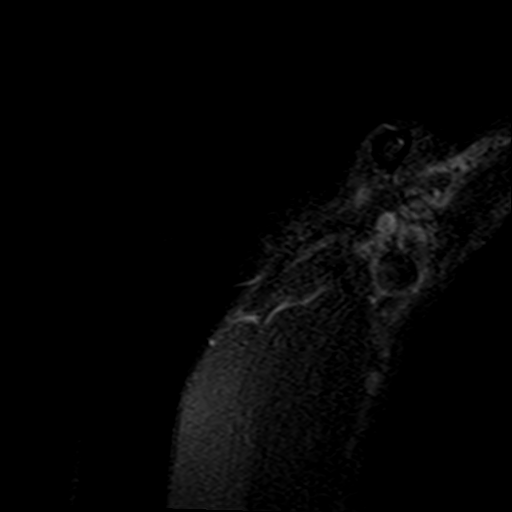
[im 4/21]
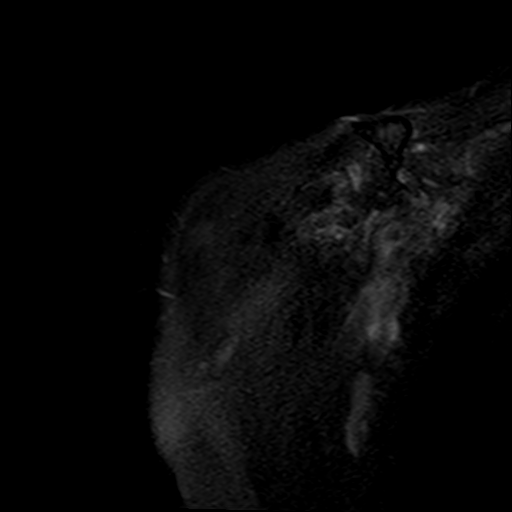
[im 11/21]
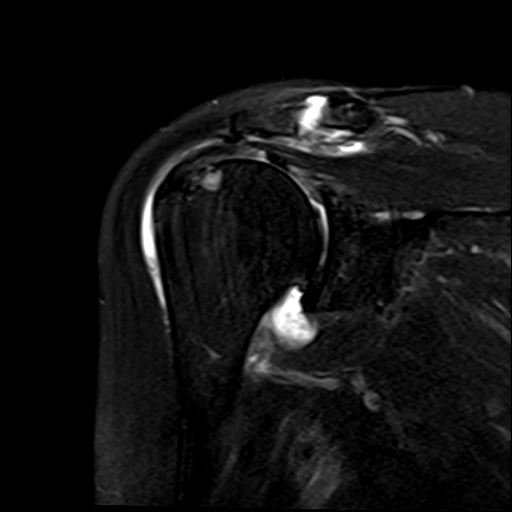
[im 17/21]
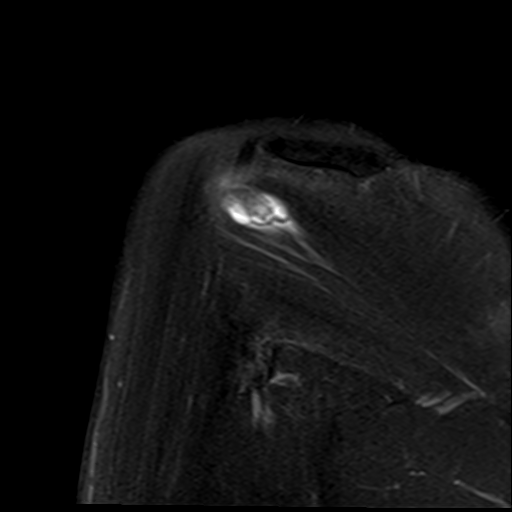

[Series 8: PD · sagittal · right · 4.0mm · 0.22mm/px · 7 of 21 slices shown]
[im 1/21]
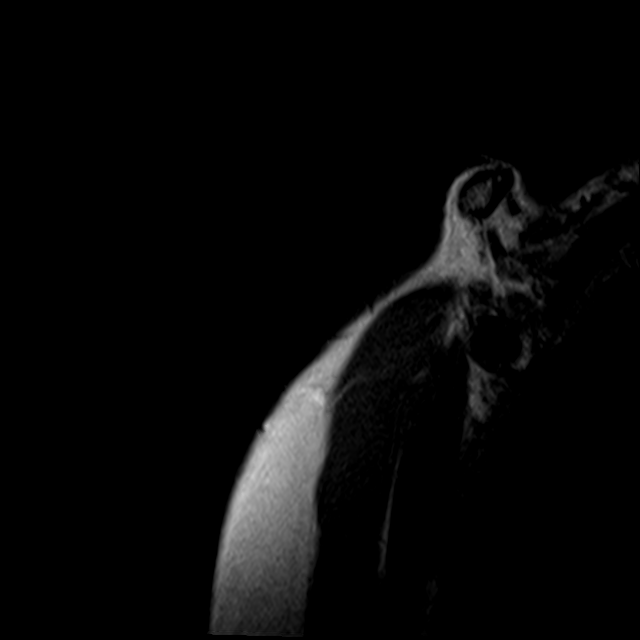
[im 4/21]
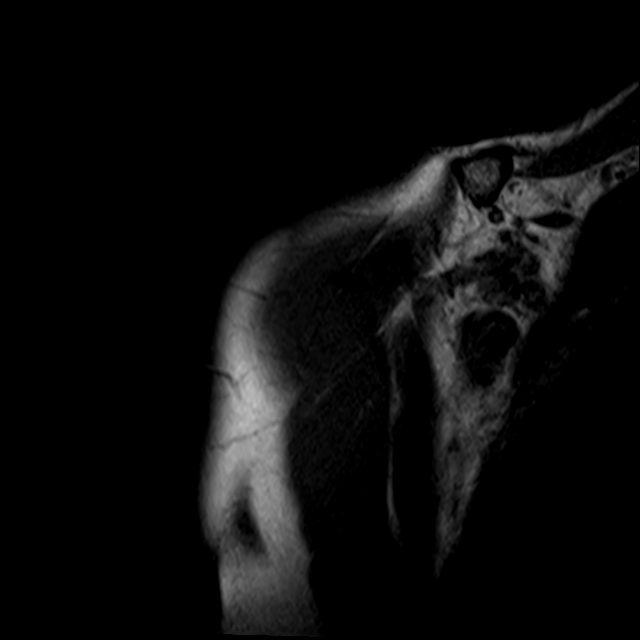
[im 7/21]
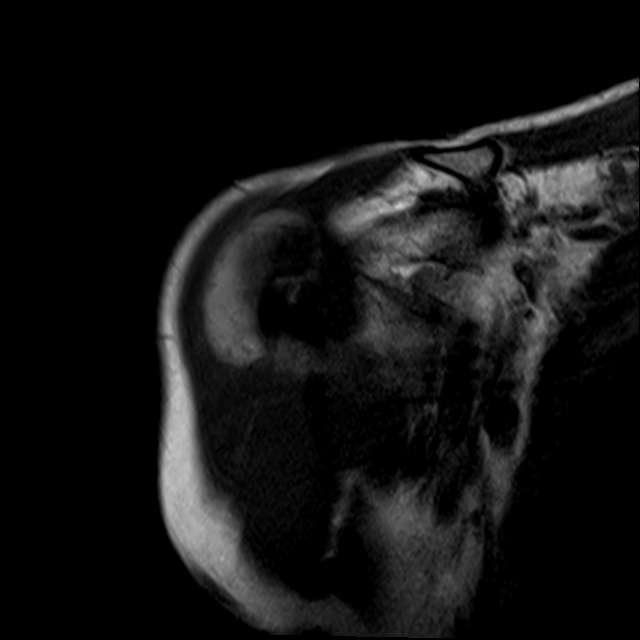
[im 11/21]
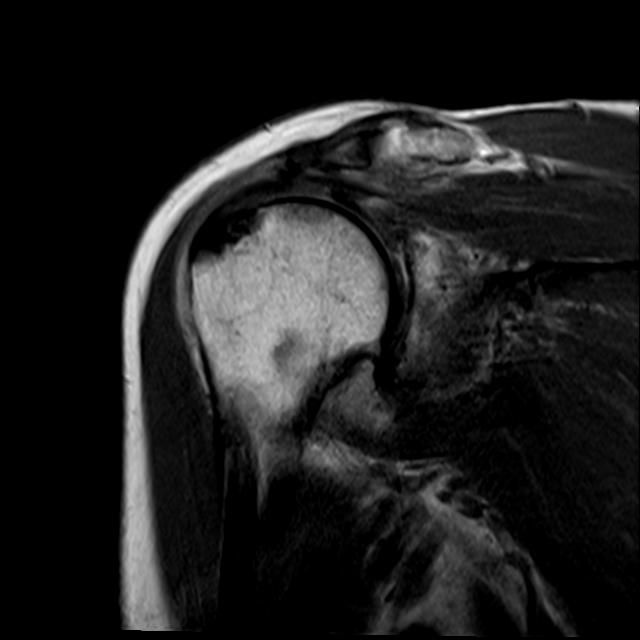
[im 14/21]
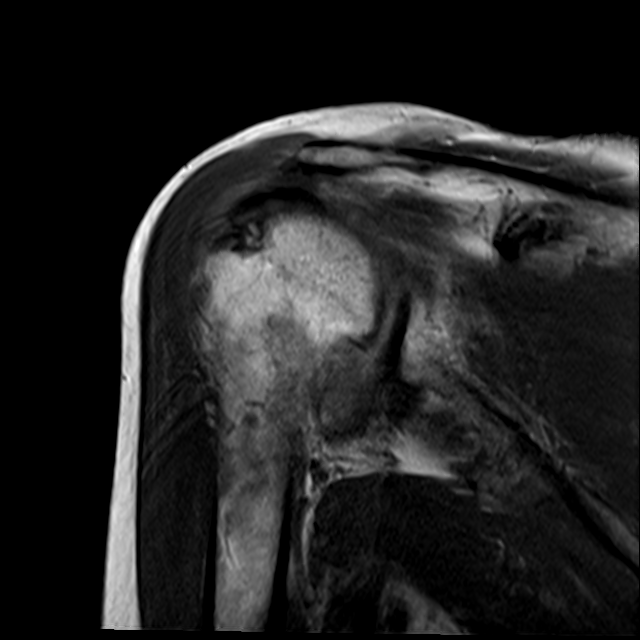
[im 17/21]
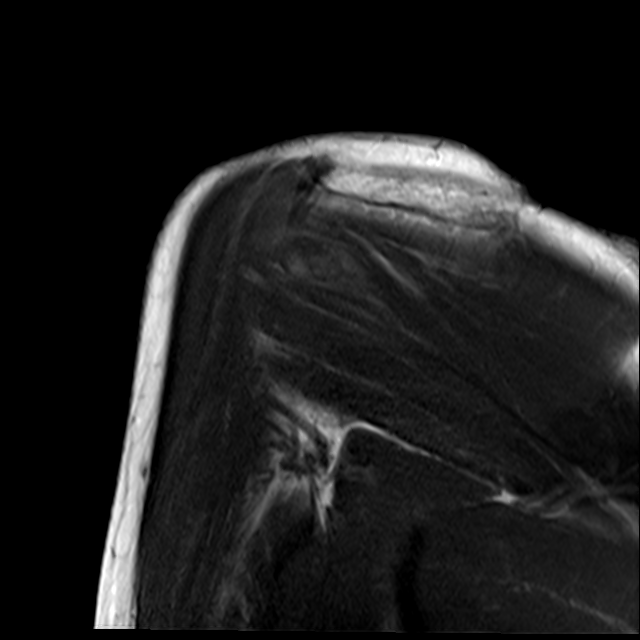
[im 21/21]
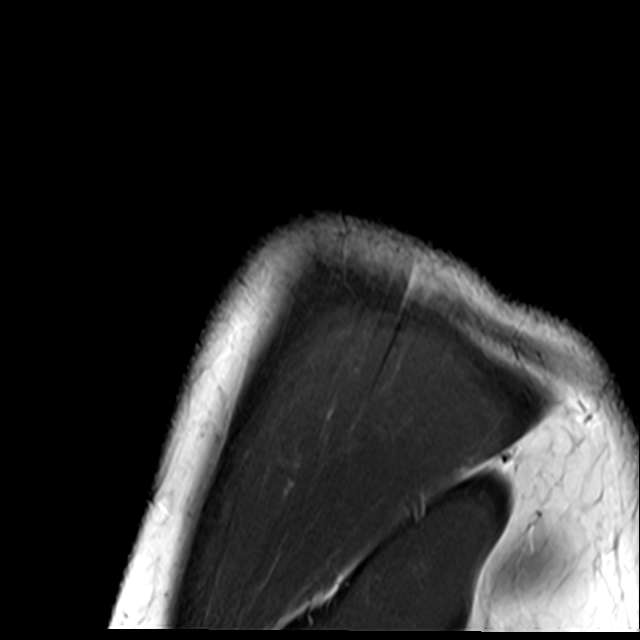

[Series 9: T2 fat-sat · oblique · right · 4.0mm · 0.55mm/px · 3 of 23 slices shown (3 of 3)]
[im 4/23]
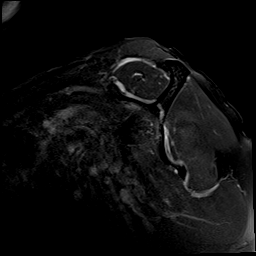
[im 13/23]
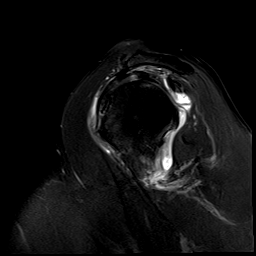
[im 19/23]
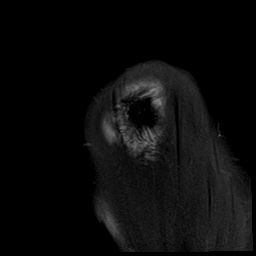

[22 of 40 positions shown; findings below may reference images not displayed]

FINDINGS: Technical note: Despite efforts by the technologist and patient,
motion artifact is present on today's exam and could not be
eliminated. This reduces exam sensitivity and specificity.

Rotator cuff: Moderate rotator cuff tendinosis. Extensive high-grade
undersurface tearing of the mid to distal subscapularis tendon. Tiny
rim rent tear of the posterior infraspinatus tendon. No
full-thickness rotator cuff tear.

Muscles: Preserved bulk and signal intensity of the rotator cuff
musculature without edema, atrophy, or fatty infiltration.

Biceps long head: Nonvisualization of the intra-articular biceps
tendon, which may be torn and retracted.

Acromioclavicular Joint: Mild arthropathy of the AC joint with trace
joint effusion. Small volume subacromial-subdeltoid bursal fluid.

Glenohumeral Joint: Moderate diffuse chondral thinning. Moderate
sized glenohumeral joint effusion. Internal heterogeneity within the
joint space suggestive of synovitis. More focal area of probable
synovitis along the posterior margin of the humeral head (series 6,
image 12).

Labrum:  Labrum appears circumferentially degenerated.

Bones: Subcortical cystic changes within the greater tuberosity. No
acute fracture or dislocation. No suspicious bone lesion.

Other: None.
IMPRESSION: 1. Moderate rotator cuff tendinosis with extensive high-grade
undersurface tearing of the subscapularis tendon.
2. Nonvisualization of the intra-articular biceps tendon, which may
be torn and retracted.
3. Moderate glenohumeral joint effusion with evidence of synovitis.
4. Mild subacromial-subdeltoid bursitis.
5. Mild AC joint arthropathy.

## 2021-02-01 HISTORY — PX: ORTHOPEDIC SURGERY: SHX850

## 2021-02-04 DIAGNOSIS — M25511 Pain in right shoulder: Secondary | ICD-10-CM | POA: Diagnosis not present

## 2021-02-16 DIAGNOSIS — G5601 Carpal tunnel syndrome, right upper limb: Secondary | ICD-10-CM | POA: Diagnosis not present

## 2021-03-02 DIAGNOSIS — M25641 Stiffness of right hand, not elsewhere classified: Secondary | ICD-10-CM | POA: Diagnosis not present

## 2021-03-17 DIAGNOSIS — R2230 Localized swelling, mass and lump, unspecified upper limb: Secondary | ICD-10-CM | POA: Insufficient documentation

## 2021-03-24 ENCOUNTER — Ambulatory Visit: Payer: Medicare HMO

## 2021-03-26 ENCOUNTER — Ambulatory Visit
Admission: RE | Admit: 2021-03-26 | Discharge: 2021-03-26 | Disposition: A | Discharge status: Home / Self Care | Payer: Medicare HMO | Source: Ambulatory Visit | Attending: Family Medicine | Admitting: Family Medicine

## 2021-03-26 ENCOUNTER — Other Ambulatory Visit: Payer: Self-pay

## 2021-03-26 DIAGNOSIS — Z1231 Encounter for screening mammogram for malignant neoplasm of breast: Secondary | ICD-10-CM | POA: Diagnosis not present

## 2021-03-26 IMAGING — MG MM DIGITAL SCREENING BILAT W/ TOMO AND CAD
8 series · 8 of 24 positions shown · non-contrast
Comparison: Previous exam(s).

CLINICAL DATA: Screening.

EXAM:
DIGITAL SCREENING BILATERAL MAMMOGRAM WITH TOMOSYNTHESIS AND CAD
TECHNIQUE: Bilateral screening digital craniocaudal and mediolateral oblique
mammograms were obtained. Bilateral screening digital breast
tomosynthesis was performed. The images were evaluated with
computer-aided detection.

[R MLO synth-2D]
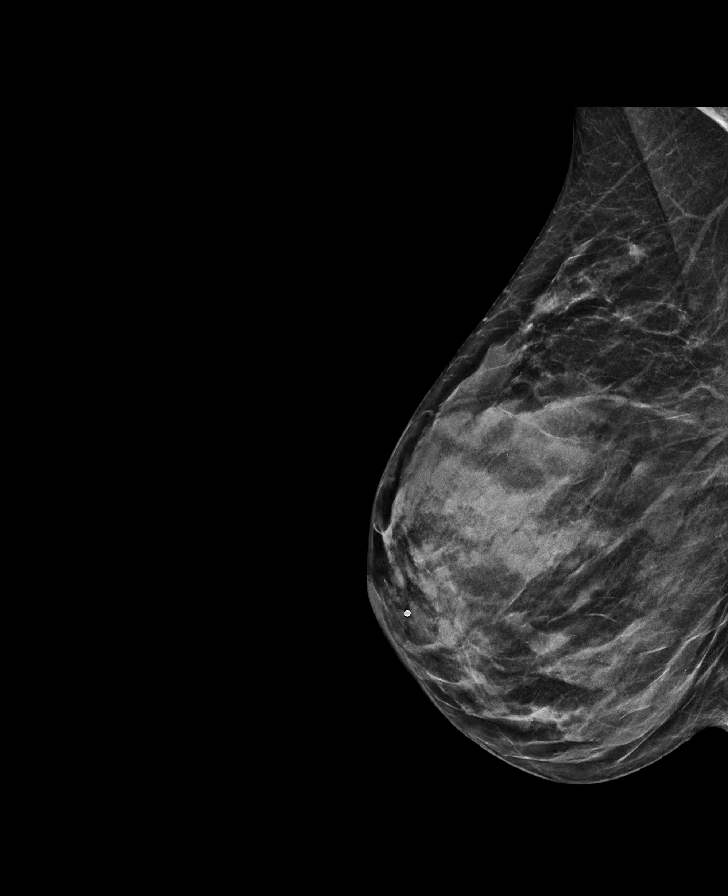

[L MLO synth-2D]
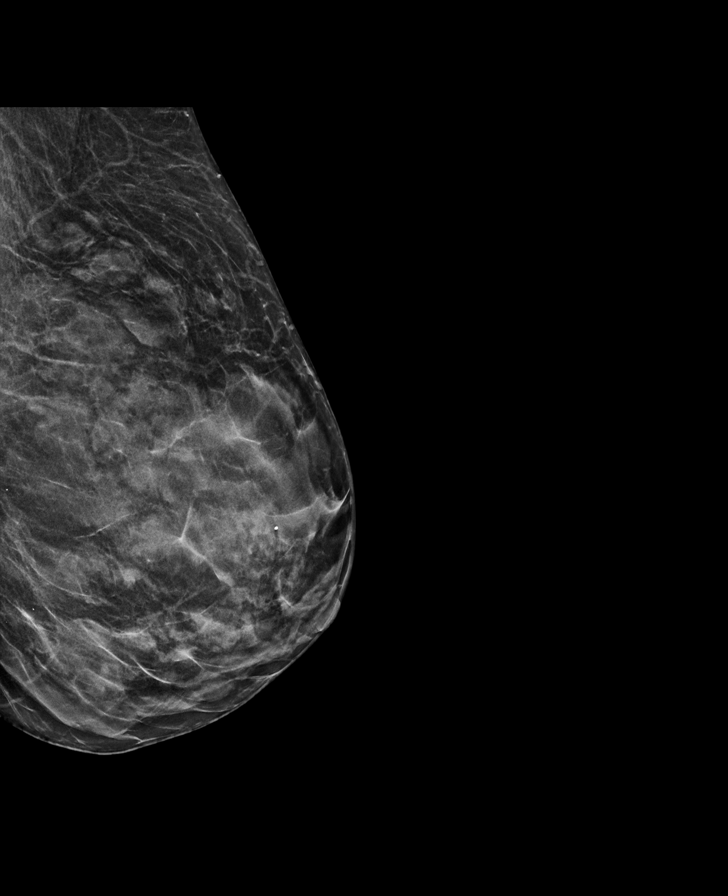

[L CC synth-2D]
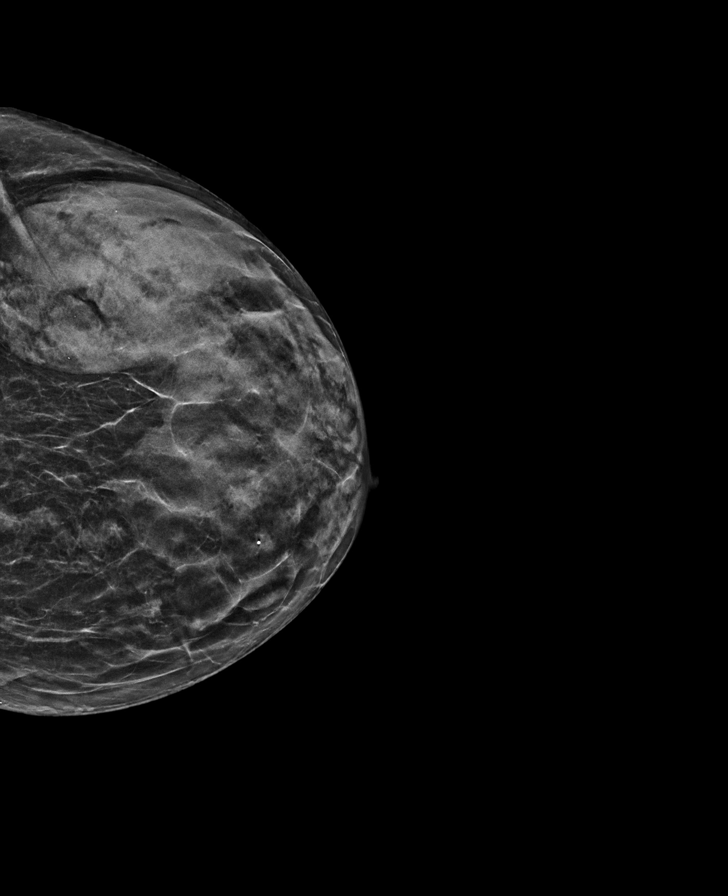

[R CC synth-2D]
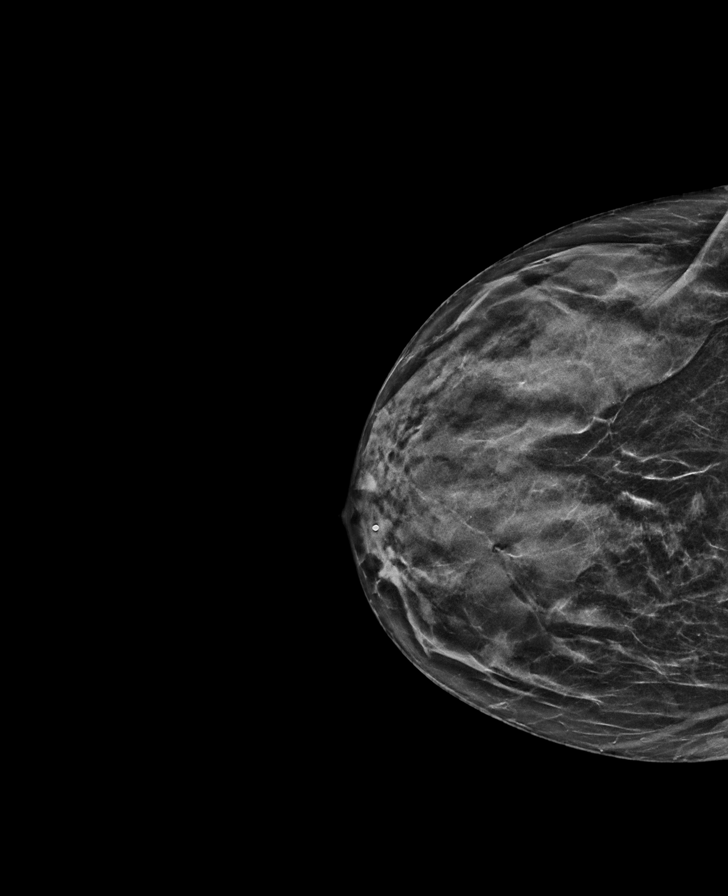

[R MLO tomo · tomo slice 26/51.0]
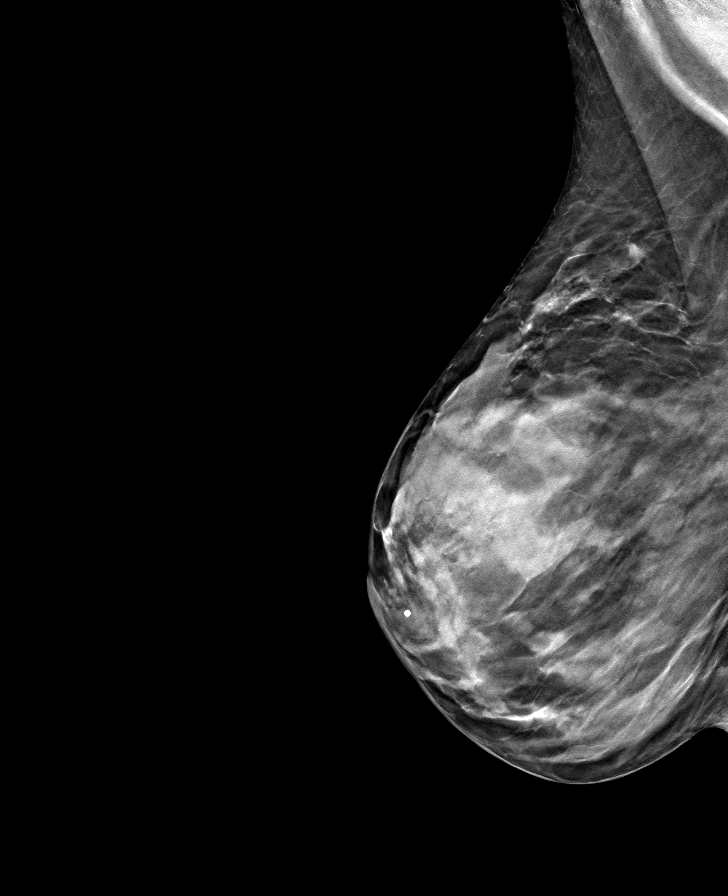

[L CC tomo · tomo slice 26/51.0]
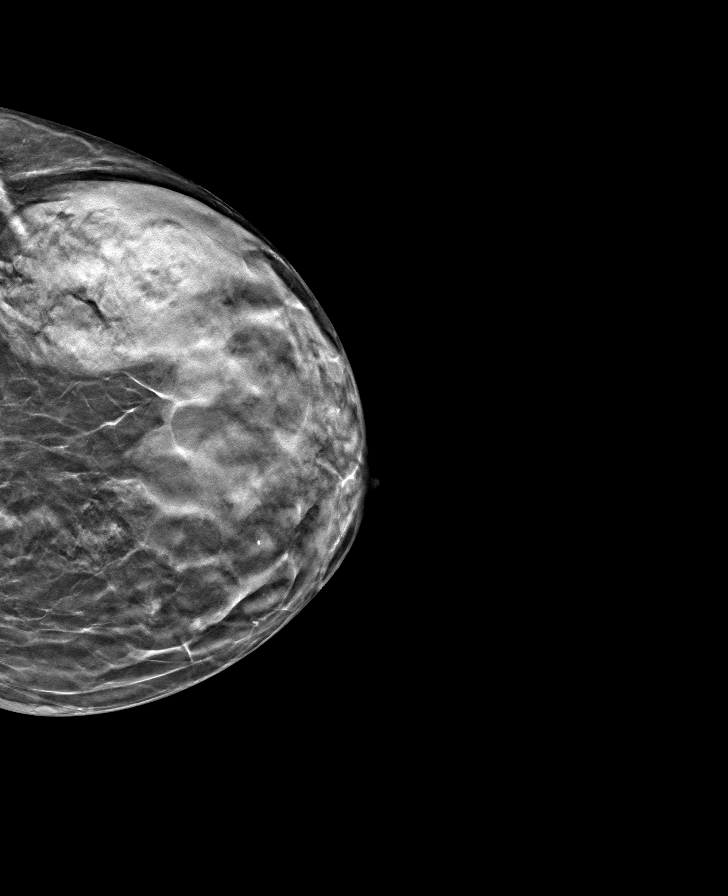

[L MLO tomo · tomo slice 25/50.0]
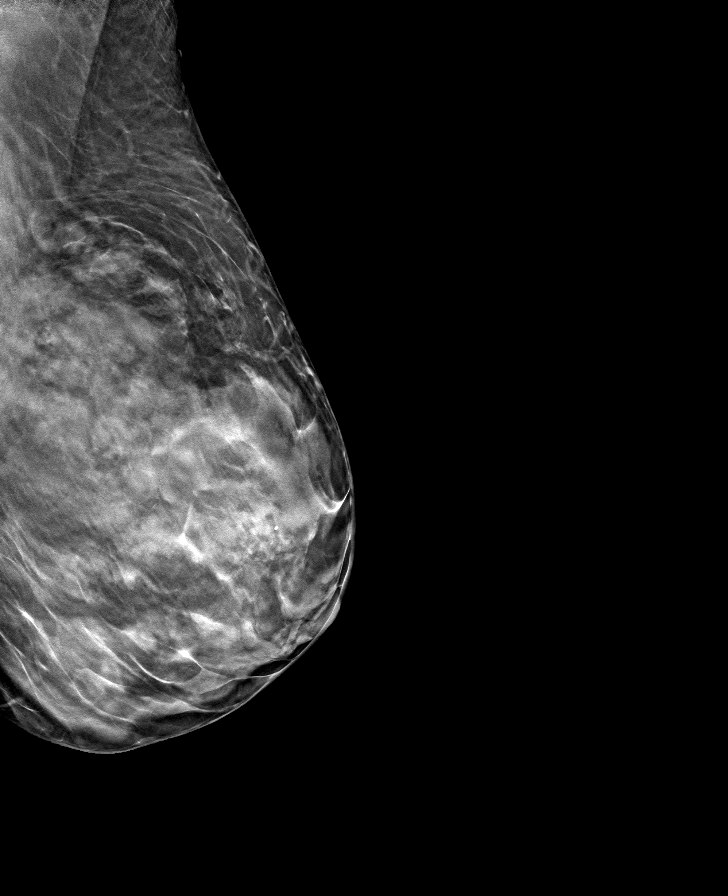

[R CC tomo · tomo slice 26/51.0]
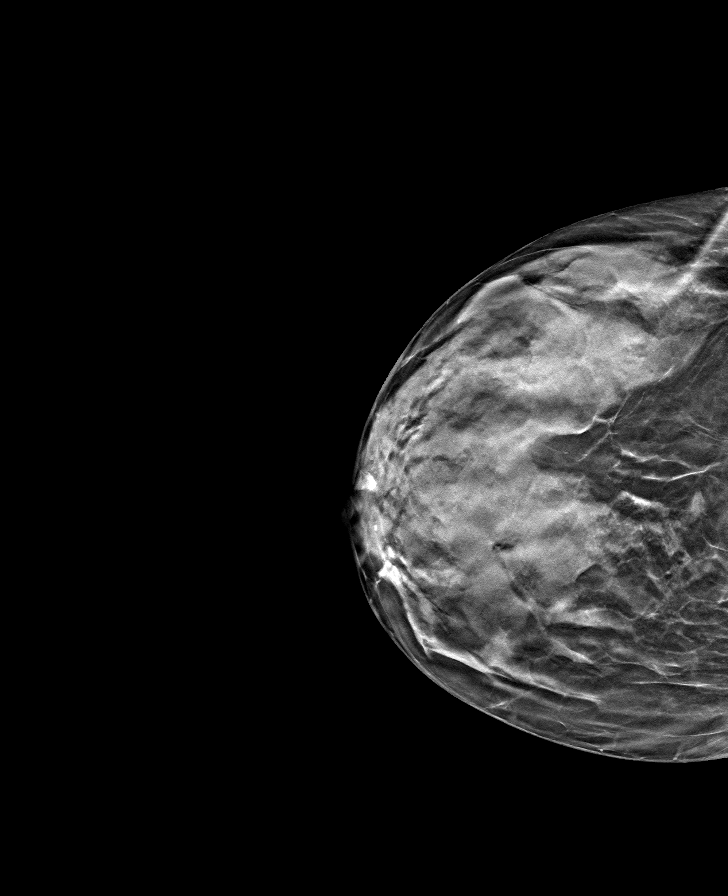

[8 of 24 positions shown; findings below may reference images not displayed]

ACR Breast Density Category d: The breast tissue is extremely dense,
which lowers the sensitivity of mammography
FINDINGS: There are no findings suspicious for malignancy.
IMPRESSION: No mammographic evidence of malignancy. A result letter of this
screening mammogram will be mailed directly to the patient.

RECOMMENDATION:
Screening mammogram in one year. (Code:[7P])

BI-RADS CATEGORY  1: Negative.

## 2021-04-19 ENCOUNTER — Other Ambulatory Visit: Payer: Self-pay | Admitting: Orthopedic Surgery

## 2021-04-19 DIAGNOSIS — M25562 Pain in left knee: Secondary | ICD-10-CM | POA: Insufficient documentation

## 2021-04-19 DIAGNOSIS — M5459 Other low back pain: Secondary | ICD-10-CM

## 2021-04-19 DIAGNOSIS — M545 Low back pain, unspecified: Secondary | ICD-10-CM | POA: Insufficient documentation

## 2021-04-19 DIAGNOSIS — M5451 Vertebrogenic low back pain: Secondary | ICD-10-CM | POA: Diagnosis not present

## 2021-04-23 ENCOUNTER — Ambulatory Visit
Admission: RE | Admit: 2021-04-23 | Discharge: 2021-04-23 | Disposition: A | Discharge status: Home / Self Care | Payer: Medicare HMO | Source: Ambulatory Visit | Attending: Orthopedic Surgery | Admitting: Orthopedic Surgery

## 2021-04-23 ENCOUNTER — Other Ambulatory Visit: Payer: Self-pay

## 2021-04-23 DIAGNOSIS — M5459 Other low back pain: Secondary | ICD-10-CM

## 2021-04-23 DIAGNOSIS — M48061 Spinal stenosis, lumbar region without neurogenic claudication: Secondary | ICD-10-CM | POA: Diagnosis not present

## 2021-04-23 DIAGNOSIS — M545 Low back pain, unspecified: Secondary | ICD-10-CM | POA: Diagnosis not present

## 2021-04-23 IMAGING — MR MR LUMBAR SPINE W/O CM
4 of 5 series · 25 of 48 positions shown · non-contrast
Comparison: [DATE].

CLINICAL DATA: Other low back pain [XD] ([XD]-CM)

EXAM:
MRI LUMBAR SPINE WITHOUT CONTRAST
TECHNIQUE: Multiplanar, multisequence MR imaging of the lumbar spine was
performed. No intravenous contrast was administered.

[Series 3: T2 · sagittal · 4.0mm · 0.53mm/px · 8 of 19 slices shown (1 of 2)]
[im 1/19]
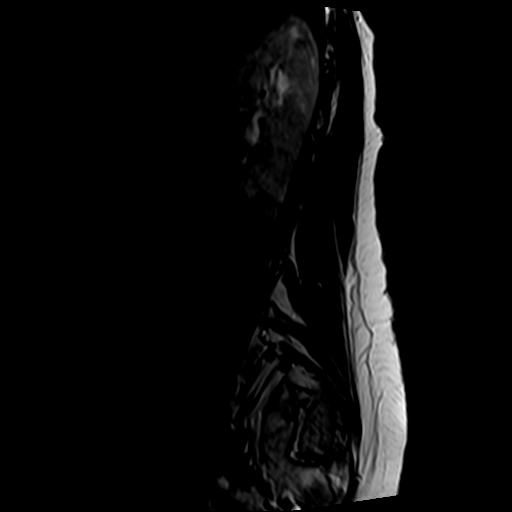
[im 3/19]
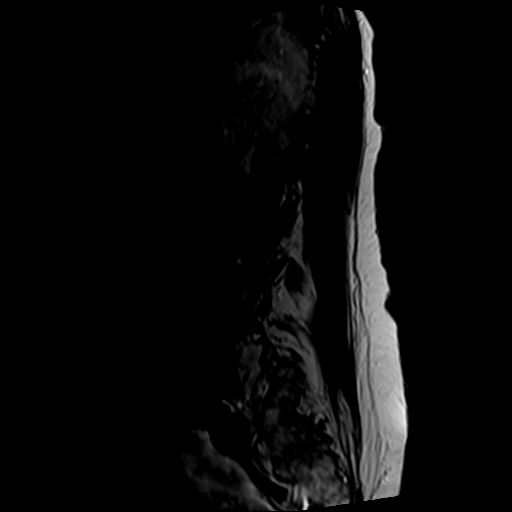
[im 6/19]
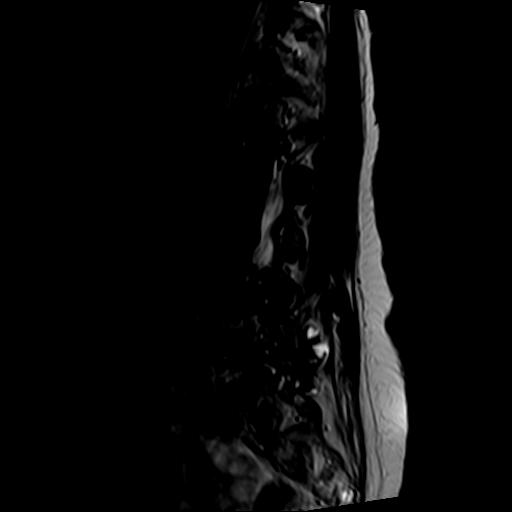
[im 8/19]
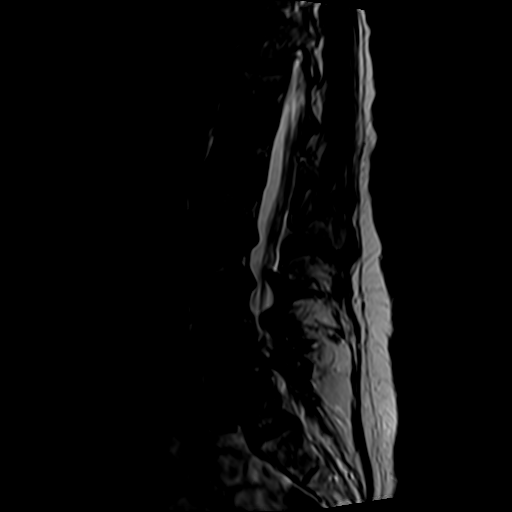
[im 11/19]
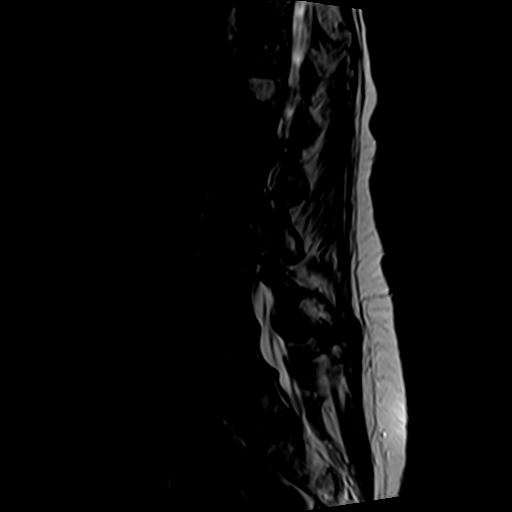
[im 13/19]
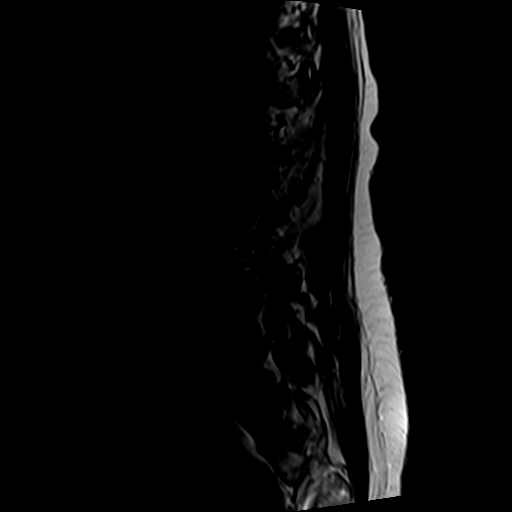
[im 16/19]
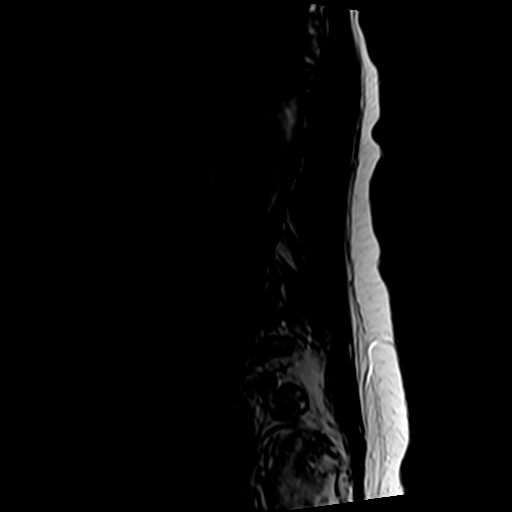
[im 19/19]
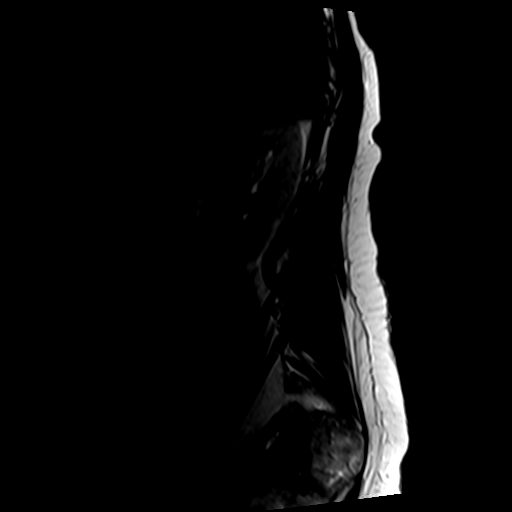

[Series 5: T1 · sagittal · 4.0mm · 0.53mm/px · 5 of 19 slices shown (1 of 2)]
[im 1/19]
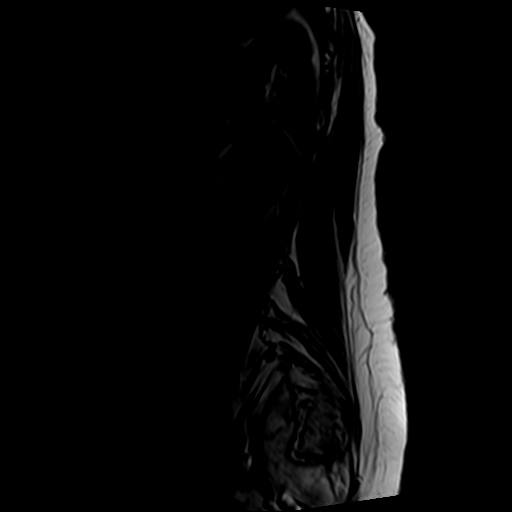
[im 4/19]
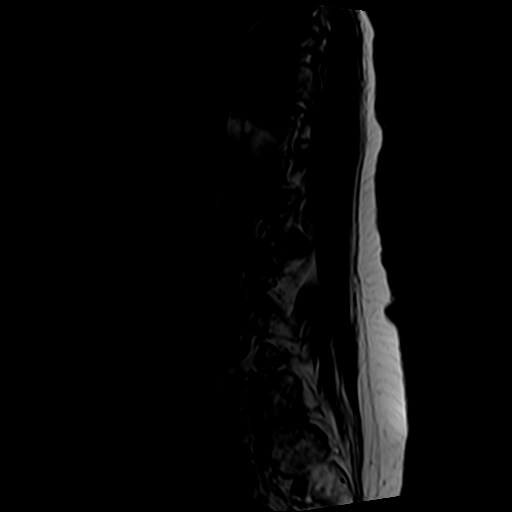
[im 7/19]
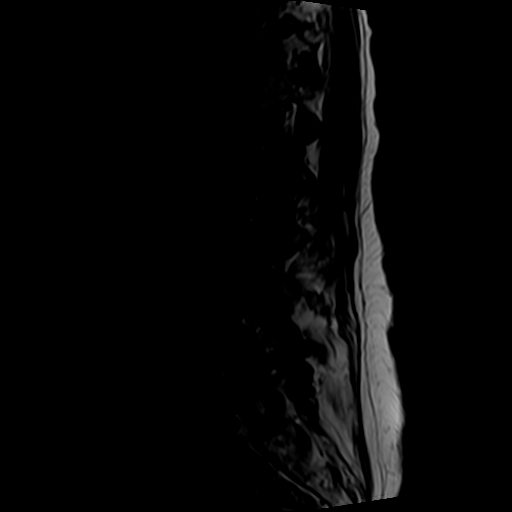
[im 10/19]
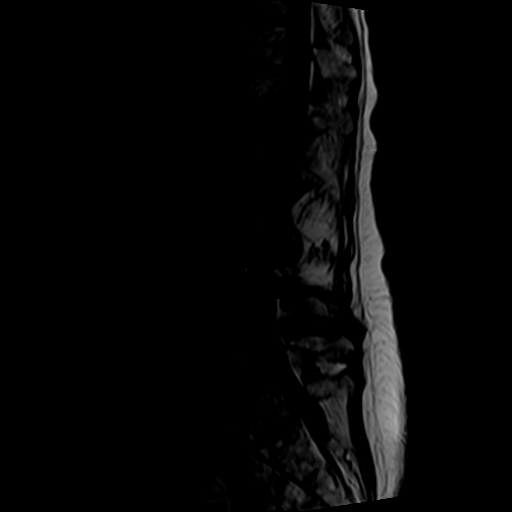
[im 16/19]
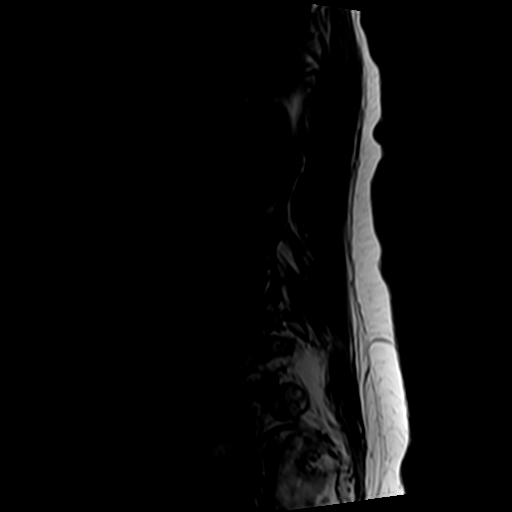

[Series 6: T2 · axial · 4.0mm · 0.70mm/px · z∈[-112,+73]mm · 9 of 36 slices shown (2 of 2)]
[im 1/36]
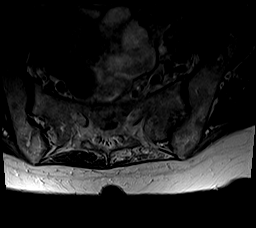
[im 6/36]
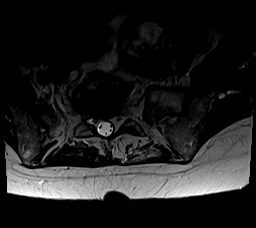
[im 12/36]
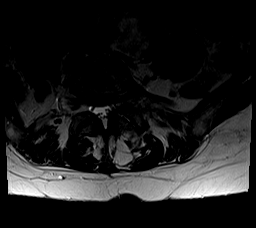
[im 15/36]
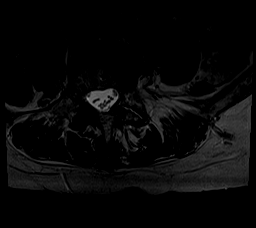
[im 18/36]
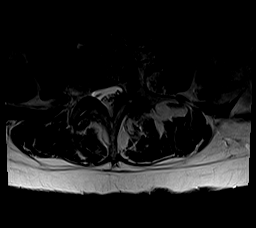
[im 21/36]
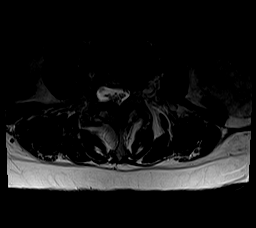
[im 24/36]
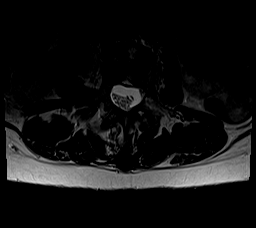
[im 30/36]
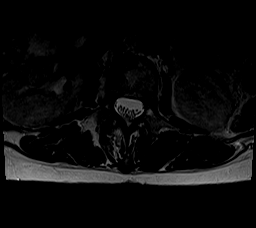
[im 36/36]
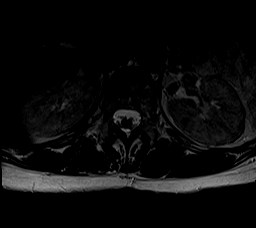

[Series 7: T1 · axial · 4.0mm · 0.35mm/px · z∈[-86,+42]mm · 3 of 36 slices shown (2 of 2)]
[im 6/36]
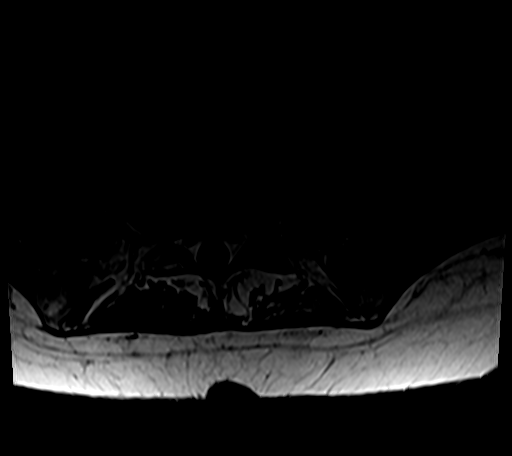
[im 18/36]
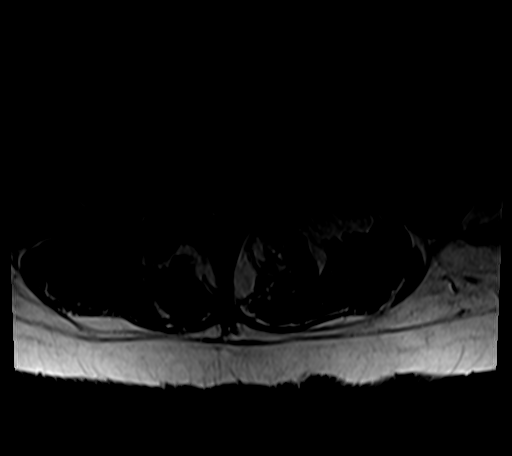
[im 30/36]
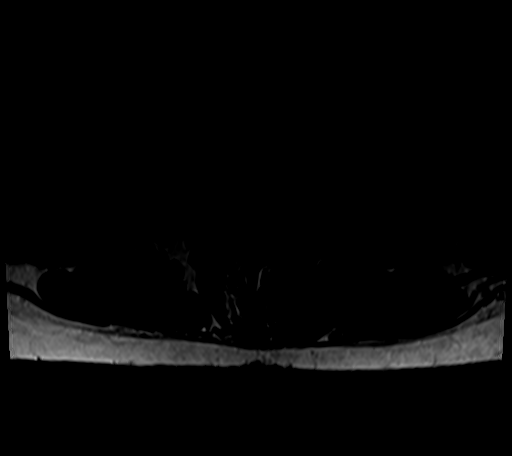

[25 of 48 positions shown; findings below may reference images not displayed]

FINDINGS: Segmentation: Transitional lumbosacral anatomy with partially
lumbarized S1. Same numbering as on the prior MRI.

Alignment:  Grade 1 anterolisthesis of L5 on S1.

Vertebrae: Degenerative/discogenic endplate signal changes about
L2-L3 and L5-S1. Benign vertebral venous malformations at multiple
levels, largest at T12. Heterogeneous bone marrow without suspicious
bone lesion.

Conus medullaris and cauda equina: Conus extends to the L1-L2 level.
Conus appears normal.

Paraspinal and other soft tissues: Unremarkable.

Disc levels:

T12-L1: No significant disc protrusion, foraminal stenosis, or canal
stenosis.

L1-L2: No significant disc protrusion, foraminal stenosis, or canal
stenosis.

L2-L3: Right eccentric disc bulge with endplate spurring. Resulting
mild right foraminal stenosis, similar. No significant canal or left
foraminal stenosis.

L3-L4: Right eccentric disc bulge with moderate bilateral facet
arthropathy and ligamentum flavum thickening. Resulting moderate
right greater than left foraminal stenosis and mild canal stenosis,
similar.

L4-L5: Mild disc bulge with moderate bilateral facet hypertrophy and
ligamentum flavum thickening. No significant canal or foraminal
stenosis.

L5-S1: Left eccentric disc bulge with endplate spurring. Moderate
left and mild right facet arthropathy. Resulting moderate to severe
left foraminal stenosis, similar versus slightly progressed.
IMPRESSION: 1. Transitional lumbosacral anatomy with partially lumbarized S1.
Same numbering as on the prior MRI.
2. Similar versus slightly progressed moderate to severe left
foraminal stenosis at L5-S1.
3. Similar moderate right greater than left foraminal stenosis at
L3-L4.
4. Similar mild bilateral foraminal stenosis at L2-L3.

## 2021-04-27 DIAGNOSIS — M810 Age-related osteoporosis without current pathological fracture: Secondary | ICD-10-CM | POA: Diagnosis not present

## 2021-04-28 DIAGNOSIS — G8312 Monoplegia of lower limb affecting left dominant side: Secondary | ICD-10-CM | POA: Diagnosis not present

## 2021-04-28 DIAGNOSIS — M5459 Other low back pain: Secondary | ICD-10-CM | POA: Diagnosis not present

## 2021-04-28 DIAGNOSIS — M25552 Pain in left hip: Secondary | ICD-10-CM | POA: Diagnosis not present

## 2021-05-05 DIAGNOSIS — M5459 Other low back pain: Secondary | ICD-10-CM | POA: Diagnosis not present

## 2021-05-05 DIAGNOSIS — M25552 Pain in left hip: Secondary | ICD-10-CM | POA: Diagnosis not present

## 2021-05-05 DIAGNOSIS — M48062 Spinal stenosis, lumbar region with neurogenic claudication: Secondary | ICD-10-CM | POA: Diagnosis not present

## 2021-05-16 DIAGNOSIS — R29818 Other symptoms and signs involving the nervous system: Secondary | ICD-10-CM | POA: Insufficient documentation

## 2021-05-18 ENCOUNTER — Other Ambulatory Visit: Payer: Self-pay | Admitting: Orthopedic Surgery

## 2021-05-18 DIAGNOSIS — M5459 Other low back pain: Secondary | ICD-10-CM

## 2021-05-24 DIAGNOSIS — M25562 Pain in left knee: Secondary | ICD-10-CM | POA: Diagnosis not present

## 2021-06-01 DIAGNOSIS — M25562 Pain in left knee: Secondary | ICD-10-CM | POA: Diagnosis not present

## 2021-06-07 DIAGNOSIS — I700 Atherosclerosis of aorta: Secondary | ICD-10-CM | POA: Diagnosis not present

## 2021-06-07 DIAGNOSIS — R7303 Prediabetes: Secondary | ICD-10-CM | POA: Diagnosis not present

## 2021-06-07 DIAGNOSIS — E7800 Pure hypercholesterolemia, unspecified: Secondary | ICD-10-CM | POA: Diagnosis not present

## 2021-06-07 DIAGNOSIS — M810 Age-related osteoporosis without current pathological fracture: Secondary | ICD-10-CM | POA: Diagnosis not present

## 2021-06-07 DIAGNOSIS — Z79899 Other long term (current) drug therapy: Secondary | ICD-10-CM | POA: Diagnosis not present

## 2021-06-07 DIAGNOSIS — K760 Fatty (change of) liver, not elsewhere classified: Secondary | ICD-10-CM | POA: Diagnosis not present

## 2021-06-07 DIAGNOSIS — I2510 Atherosclerotic heart disease of native coronary artery without angina pectoris: Secondary | ICD-10-CM | POA: Diagnosis not present

## 2021-06-07 DIAGNOSIS — Z0000 Encounter for general adult medical examination without abnormal findings: Secondary | ICD-10-CM | POA: Diagnosis not present

## 2021-06-07 DIAGNOSIS — E2839 Other primary ovarian failure: Secondary | ICD-10-CM | POA: Diagnosis not present

## 2021-06-07 DIAGNOSIS — K8020 Calculus of gallbladder without cholecystitis without obstruction: Secondary | ICD-10-CM | POA: Diagnosis not present

## 2021-06-07 DIAGNOSIS — M519 Unspecified thoracic, thoracolumbar and lumbosacral intervertebral disc disorder: Secondary | ICD-10-CM | POA: Diagnosis not present

## 2021-06-09 DIAGNOSIS — S83282A Other tear of lateral meniscus, current injury, left knee, initial encounter: Secondary | ICD-10-CM | POA: Diagnosis not present

## 2021-06-09 DIAGNOSIS — M25562 Pain in left knee: Secondary | ICD-10-CM | POA: Diagnosis not present

## 2021-06-11 ENCOUNTER — Other Ambulatory Visit: Payer: Self-pay | Admitting: Family Medicine

## 2021-06-11 DIAGNOSIS — E2839 Other primary ovarian failure: Secondary | ICD-10-CM

## 2021-06-14 DIAGNOSIS — M25562 Pain in left knee: Secondary | ICD-10-CM | POA: Diagnosis not present

## 2021-06-15 DIAGNOSIS — M810 Age-related osteoporosis without current pathological fracture: Secondary | ICD-10-CM | POA: Diagnosis not present

## 2021-07-09 DIAGNOSIS — Z20822 Contact with and (suspected) exposure to covid-19: Secondary | ICD-10-CM | POA: Diagnosis not present

## 2021-07-12 DIAGNOSIS — N6320 Unspecified lump in the left breast, unspecified quadrant: Secondary | ICD-10-CM | POA: Diagnosis not present

## 2021-07-14 DIAGNOSIS — R197 Diarrhea, unspecified: Secondary | ICD-10-CM | POA: Diagnosis not present

## 2021-07-14 DIAGNOSIS — K52831 Collagenous colitis: Secondary | ICD-10-CM | POA: Diagnosis not present

## 2021-07-16 ENCOUNTER — Other Ambulatory Visit: Payer: Self-pay | Admitting: Obstetrics and Gynecology

## 2021-07-16 DIAGNOSIS — N6320 Unspecified lump in the left breast, unspecified quadrant: Secondary | ICD-10-CM

## 2021-07-22 ENCOUNTER — Encounter: Payer: Self-pay | Admitting: *Deleted

## 2021-07-26 ENCOUNTER — Ambulatory Visit: Payer: Medicare HMO | Admitting: Diagnostic Neuroimaging

## 2021-07-26 ENCOUNTER — Ambulatory Visit
Admission: RE | Admit: 2021-07-26 | Discharge: 2021-07-26 | Disposition: A | Discharge status: Home / Self Care | Payer: Medicare HMO | Source: Ambulatory Visit | Attending: Obstetrics and Gynecology | Admitting: Obstetrics and Gynecology

## 2021-07-26 ENCOUNTER — Other Ambulatory Visit: Payer: Self-pay | Admitting: Obstetrics and Gynecology

## 2021-07-26 ENCOUNTER — Encounter: Payer: Self-pay | Admitting: Diagnostic Neuroimaging

## 2021-07-26 VITALS — BP 126/79 | HR 71 | Ht 61.0 in | Wt 122.0 lb

## 2021-07-26 DIAGNOSIS — R29898 Other symptoms and signs involving the musculoskeletal system: Secondary | ICD-10-CM

## 2021-07-26 DIAGNOSIS — N6320 Unspecified lump in the left breast, unspecified quadrant: Secondary | ICD-10-CM

## 2021-07-26 DIAGNOSIS — R922 Inconclusive mammogram: Secondary | ICD-10-CM | POA: Diagnosis not present

## 2021-07-26 DIAGNOSIS — N6321 Unspecified lump in the left breast, upper outer quadrant: Secondary | ICD-10-CM | POA: Diagnosis not present

## 2021-07-26 IMAGING — US US BREAST*L* LIMITED INC AXILLA
1 series · 3 of 3 positions shown · non-contrast
Comparison: Previous exam(s).

CLINICAL DATA: Patient fell 6 weeks ago. She had bruise in the
UPPER-OUTER QUADRANT the LEFT breast. Bruising has resolved, but the
patient feels a mass in the UPPER-OUTER QUADRANT of the LEFT breast.

EXAM:
DIGITAL DIAGNOSTIC UNILATERAL LEFT MAMMOGRAM WITH TOMOSYNTHESIS AND
CAD; ULTRASOUND LEFT BREAST LIMITED
TECHNIQUE: Left digital diagnostic mammography and breast tomosynthesis was
performed. The images were evaluated with computer-aided detection.;
Targeted ultrasound examination of the left breast was performed.

[Series 1: us breast*left* limited inc axilla · 0.07mm/px · 3 of 3 slices shown]
[im 1/3]
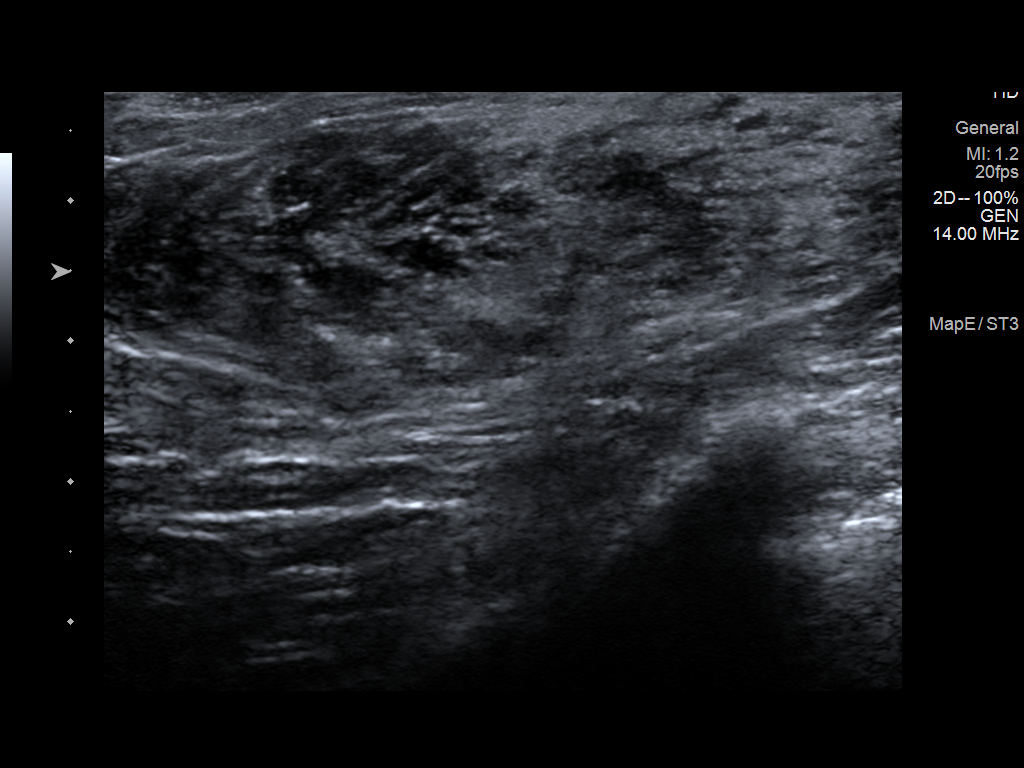
[im 2/3]
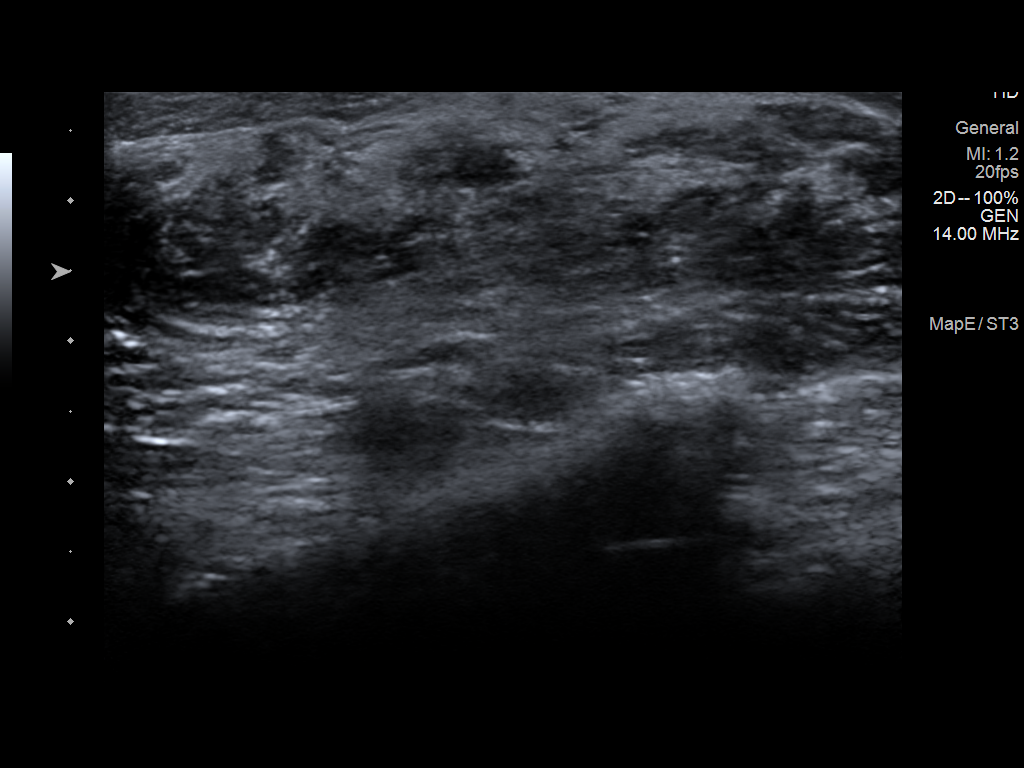
[im 3/3]
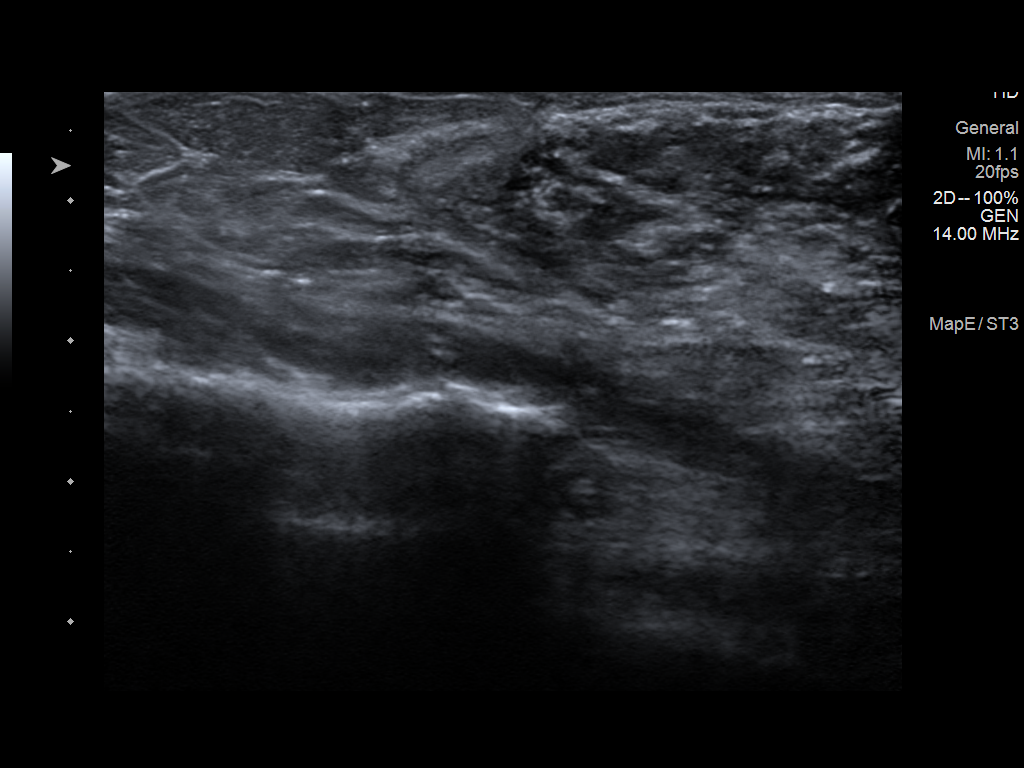

[3 of 3 positions shown; findings below may reference images not displayed]

ACR Breast Density Category d: The breast tissue is extremely dense,
which lowers the sensitivity of mammography.
FINDINGS: No suspicious mass, distortion, or microcalcifications are
identified to suggest presence of malignancy. Spot tangential view
is performed in the UPPER-OUTER QUADRANT of the LEFT breast and
shows normal appearing extremely dense fibroglandular tissue without
mass.

On physical exam, I palpate a distinct ridge of tissue in the 2
o'clock location of the LEFT breast in the area of concern. I
palpate no discrete mass. There is no visible ecchymosis.

Targeted ultrasound is performed, showing extremely dense
fibroglandular tissue in the area of concern. No suspicious mass,
distortion, or acoustic shadowing is demonstrated with ultrasound.
IMPRESSION: No mammographic or ultrasound evidence for malignancy.

RECOMMENDATION:
Screening mammogram is recommended in [DATE].

I have discussed the findings and recommendations with the patient.
If applicable, a reminder letter will be sent to the patient
regarding the next appointment.

BI-RADS CATEGORY  1: Negative.

## 2021-07-26 IMAGING — MG MM DIGITAL DIAGNOSTIC UNILAT*L* W/ TOMO W/ CAD
6 series · 6 of 18 positions shown · non-contrast
Comparison: Previous exam(s).

CLINICAL DATA: Patient fell 6 weeks ago. She had bruise in the
UPPER-OUTER QUADRANT the LEFT breast. Bruising has resolved, but the
patient feels a mass in the UPPER-OUTER QUADRANT of the LEFT breast.

EXAM:
DIGITAL DIAGNOSTIC UNILATERAL LEFT MAMMOGRAM WITH TOMOSYNTHESIS AND
CAD; ULTRASOUND LEFT BREAST LIMITED
TECHNIQUE: Left digital diagnostic mammography and breast tomosynthesis was
performed. The images were evaluated with computer-aided detection.;
Targeted ultrasound examination of the left breast was performed.

[L CC synth-2D]
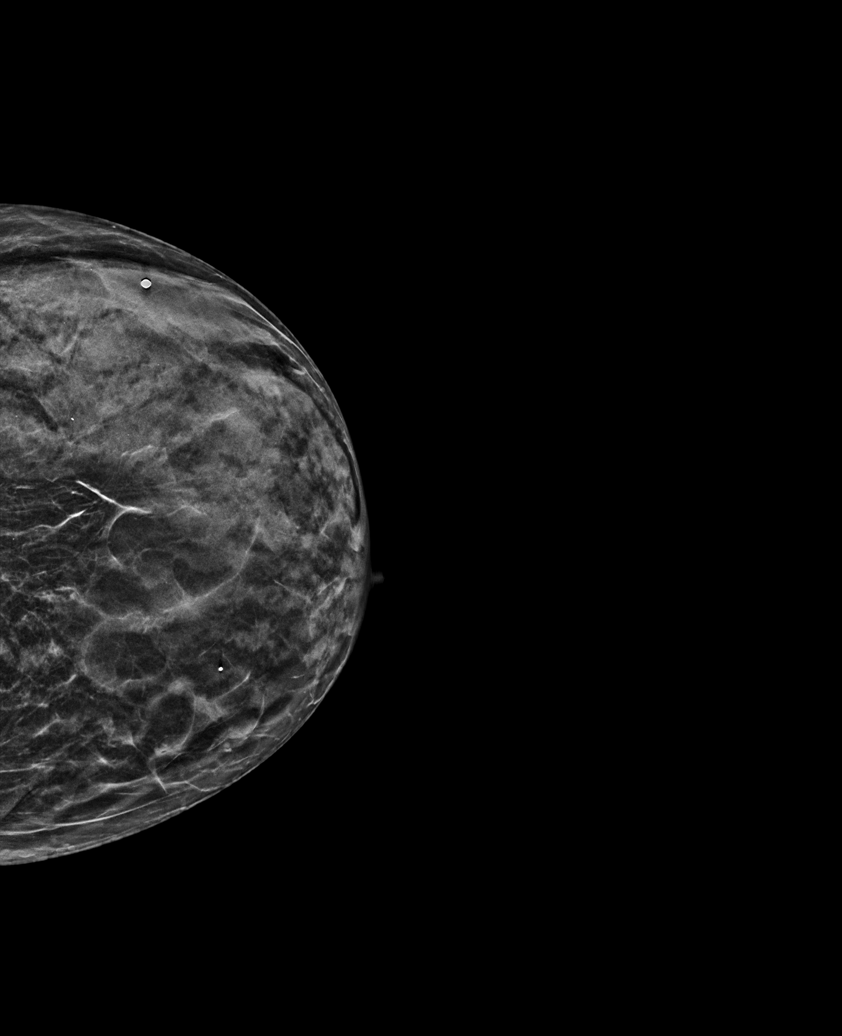

[L MLO synth-2D]
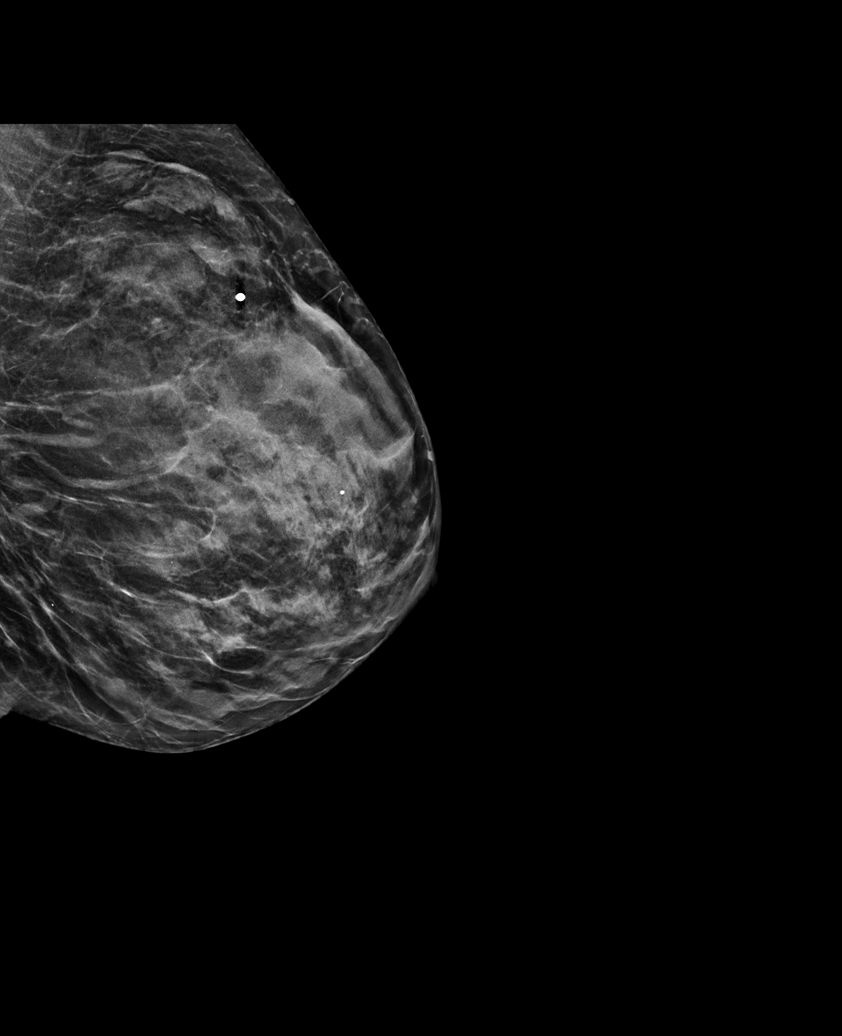

[L TAN synth-2D]
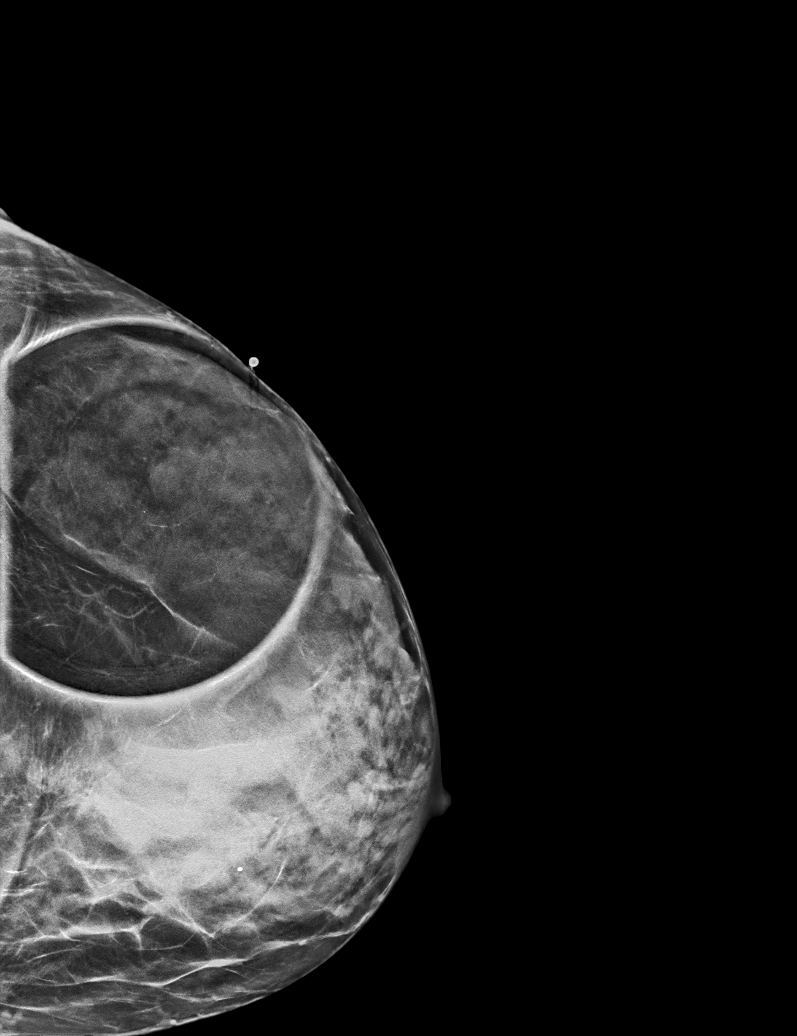

[L CC tomo · tomo slice 23/46.0]
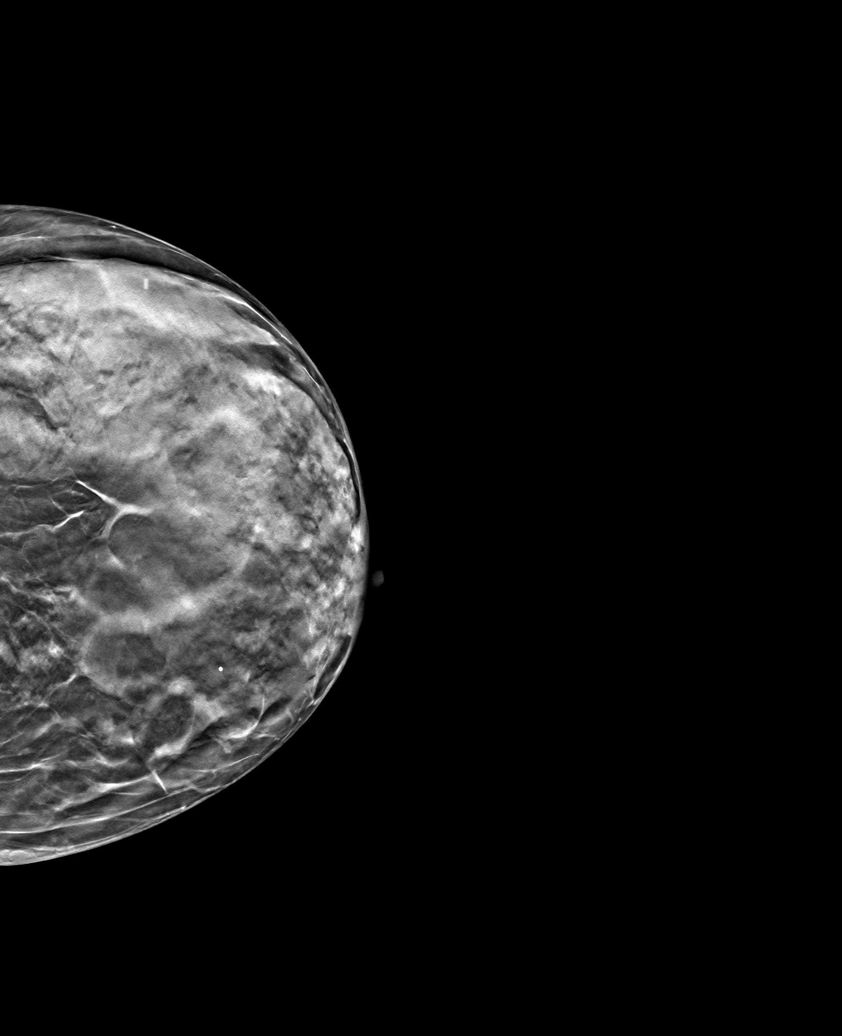

[L MLO tomo · tomo slice 25/49.0]
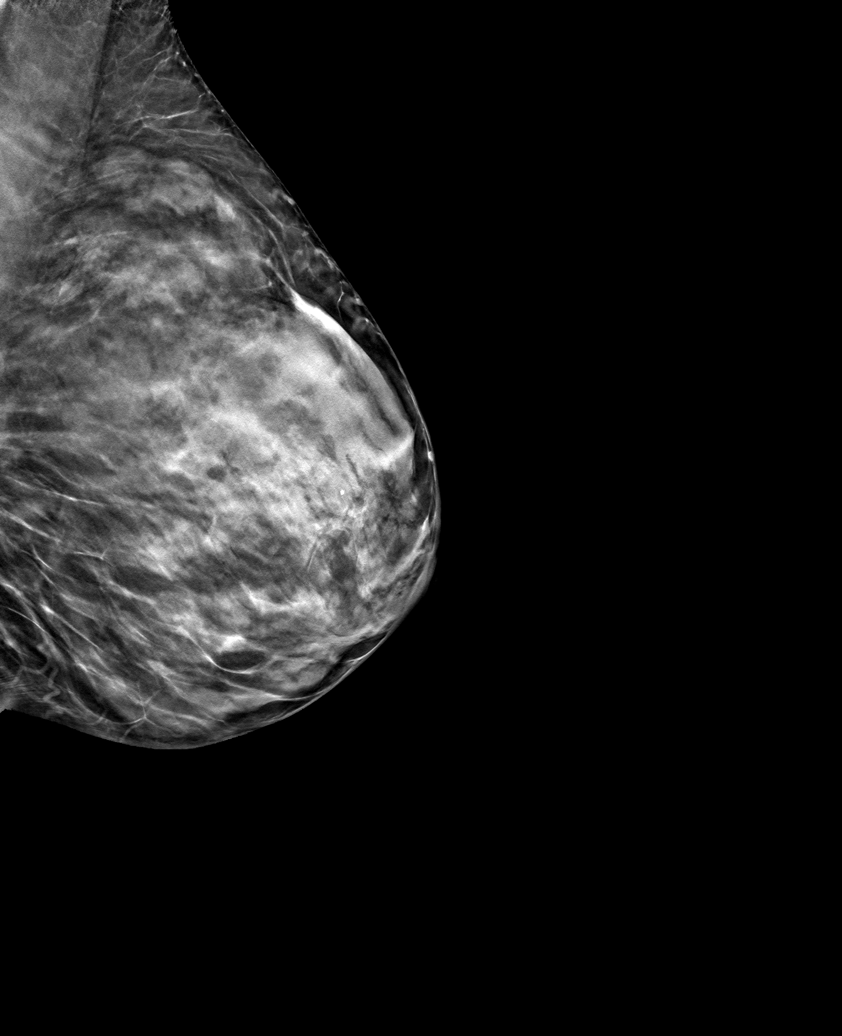

[L TAN tomo · tomo slice 23/44.0]
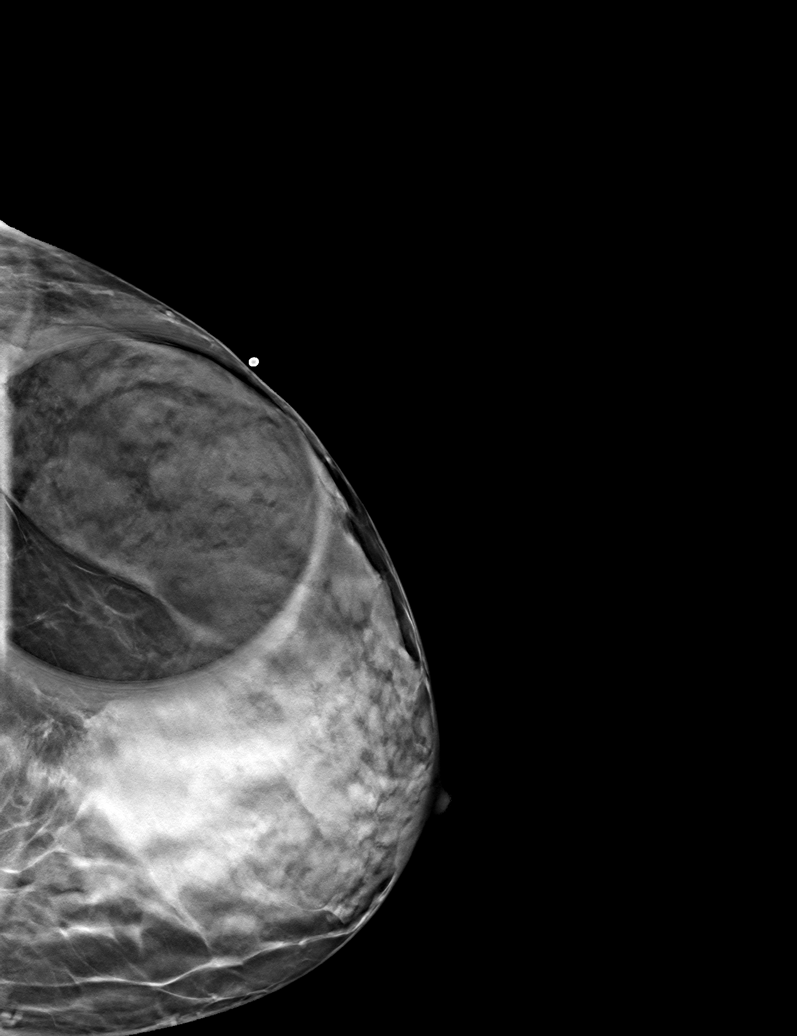

[6 of 18 positions shown; findings below may reference images not displayed]

ACR Breast Density Category d: The breast tissue is extremely dense,
which lowers the sensitivity of mammography.
FINDINGS: No suspicious mass, distortion, or microcalcifications are
identified to suggest presence of malignancy. Spot tangential view
is performed in the UPPER-OUTER QUADRANT of the LEFT breast and
shows normal appearing extremely dense fibroglandular tissue without
mass.

On physical exam, I palpate a distinct ridge of tissue in the 2
o'clock location of the LEFT breast in the area of concern. I
palpate no discrete mass. There is no visible ecchymosis.

Targeted ultrasound is performed, showing extremely dense
fibroglandular tissue in the area of concern. No suspicious mass,
distortion, or acoustic shadowing is demonstrated with ultrasound.
IMPRESSION: No mammographic or ultrasound evidence for malignancy.

RECOMMENDATION:
Screening mammogram is recommended in [DATE].

I have discussed the findings and recommendations with the patient.
If applicable, a reminder letter will be sent to the patient
regarding the next appointment.

BI-RADS CATEGORY  1: Negative.

## 2021-07-26 NOTE — Progress Notes (Signed)
[facility]  PATIENT: Carla Kelley DOB: [birth year]  REFERRING CLINICIAN: Latanya Maudlin, MD HISTORY FROM: patient REASON FOR VISIT: new consult   HISTORICAL  CHIEF COMPLAINT:  Chief Complaint  Patient presents with   Extremity Weakness    RM 6 alone Pt is well, has been having weakness in L leg for 10-15 yrs, hasn't been able to find a solution. Limp has worsen over the yrs     HISTORY OF PRESENT ILLNESS:   71 year old female here for evaluation of gait difficulty.  2002 patient had whole body and left-sided spasms lasting 30 seconds time, 10-12 episodes, over several weeks.  She went to a neurologist in [place] where she was living at that time and had multiple sclerosis evaluation.  However MRI and lumbar puncture evaluation were negative.  She was monitored with serial MRI scans over time.  She also noted some limping left gait issues around that time.  She tried physical therapy.  She was offered Ampyra to help with gait but she declined at that time.  5 years ago patient was in [place].  She was continuing to have some mild issues with her left leg and gait.  She was able to play pickle ball at the [facility] for some time but had to stop because of her left leg issues.  She went to orthopedic clinic and had MRI of the lumbar spine which showed some foraminal stenosis at L5-S1 on the left side.  Patient was then referred here for further neurologic evaluation.  Patient denies any significant pain or numbness.  She feels weakness in her left leg and feels like it drags at times.  No problems with right leg or upper extremities.  No vision changes, speech or swallowing issues.    REVIEW OF SYSTEMS: Full 14 system review of systems performed and negative with exception of: As per HPI.  ALLERGIES: Allergies  Allergen Reactions   Sulfa Antibiotics Rash   Zolpidem Rash    HOME MEDICATIONS: Outpatient Medications Prior to Visit  Medication Sig Dispense  Refill   calcium citrate (CALCITRATE - DOSED IN MG ELEMENTAL CALCIUM) 950 MG tablet Take 500 mg of elemental calcium by mouth daily.      diclofenac (CATAFLAM) 50 MG tablet Take 50 mg by mouth 3 (three) times daily.     diclofenac (VOLTAREN) 50 MG EC tablet diclofenac sodium 50 mg tablet,delayed release  TAKE 1 TABLET BY MOUTH WITH FOOD OR MILK AS NEEDED ONCE A DAY     loratadine (CLARITIN) 10 MG tablet Take 10 mg by mouth daily.     Multiple Vitamins-Minerals (MULTIVITAMIN PO) Take 1 tablet by mouth daily.     multivitamin-iron-minerals-folic acid (CENTRUM) chewable tablet Chew 1 tablet by mouth daily.     simvastatin (ZOCOR) 10 MG tablet simvastatin 10 mg tablet  TAKE 1 TABLET BY MOUTH EVERY DAY IN THE EVENING     chlorhexidine (PERIDEX) 0.12 % solution chlorhexidine gluconate 0.12 % mouthwash  PLEASE SEE ATTACHED FOR DETAILED DIRECTIONS     Chlorphen-PE-Acetaminophen 4-10-325 MG TABS Norel AD 4 mg-10 mg-325 mg tablet  TAKE 1 TABLET BY MOUTH AT BEDTIME AS NEEDED     clarithromycin (BIAXIN) 250 MG tablet clarithromycin 250 mg tablet  TAKE 1 TABLET BY MOUTH TWICE DAILY (STOP SIMVASTATING WHILE TAKING THIS)     Influenza vac split quadrivalent PF (FLUZONE HIGH-DOSE) 0.5 ML injection Fluzone High-Dose 2019-20 (PF) 180 mcg/0.5 mL intramuscular syringe  ADM 0.5ML IM UTD     oxybutynin (  DITROPAN-XL) 5 MG 24 hr tablet oxybutynin chloride ER 5 mg tablet,extended release 24 hr  Take 1 tablet by mouth once daily     tiZANidine (ZANAFLEX) 2 MG tablet Take 1 mg by mouth every 6 (six) hours as needed for muscle spasms.      No facility-administered medications prior to visit.    PAST MEDICAL HISTORY: Past Medical History:  Diagnosis Date   Carpal tunnel syndrome on right    Degenerative scoliosis    Left leg weakness    Low back pain    Pain    L hip, knee    PAST SURGICAL HISTORY: Past Surgical History:  Procedure Laterality Date   CESAREAN SECTION  07/05/1979   ORTHOPEDIC SURGERY   02/2021    FAMILY HISTORY: Family History  Problem Relation Age of Onset   Breast cancer Maternal Grandmother 5   Melanoma Mother    Melanoma Sister     SOCIAL HISTORY: Social History   Socioeconomic History   Marital status: Single    Spouse name: Not on file   Number of children: Not on file   Years of education: Not on file   Highest education level: Not on file  Occupational History   Not on file  Tobacco Use   Smoking status: Never   Smokeless tobacco: Never  Substance and Sexual Activity   Alcohol use: Never   Drug use: Never   Sexual activity: Not on file  Other Topics Concern   Not on file  Social History Narrative   Not on file   Social Determinants of Health   Financial Resource Strain: Not on file  Food Insecurity: Not on file  Transportation Needs: Not on file  Physical Activity: Not on file  Stress: Not on file  Social Connections: Not on file  Intimate Partner Violence: Not on file     PHYSICAL EXAM  GENERAL EXAM/CONSTITUTIONAL: Vitals:  Vitals:   07/26/21 0815  BP: 126/79  Pulse: 71  Weight: 122 lb (55.3 kg)  Height: 5\' 1"  (1.549 m)   Body mass index is 23.05 kg/m. Wt Readings from Last 3 Encounters:  07/26/21 122 lb (55.3 kg)   Patient is in no distress; well developed, nourished and groomed; neck is supple  CARDIOVASCULAR: Examination of carotid arteries is normal; no carotid bruits Regular rate and rhythm, no murmurs Examination of peripheral vascular system by observation and palpation is normal  EYES: Ophthalmoscopic exam of optic discs and posterior segments is normal; no papilledema or hemorrhages No results found.  MUSCULOSKELETAL: Gait, strength, tone, movements noted in Neurologic exam below  NEUROLOGIC: MENTAL STATUS:  No flowsheet data found. awake, alert, oriented to person, place and time recent and remote memory intact normal attention and concentration language fluent, comprehension intact, naming  intact fund of knowledge appropriate  CRANIAL NERVE:  2nd - no papilledema on fundoscopic exam 2nd, 3rd, 4th, 6th - pupils equal and reactive to light, visual fields full to confrontation, extraocular muscles intact, no nystagmus 5th - facial sensation symmetric 7th - facial strength symmetric 8th - hearing intact 9th - palate elevates symmetrically, uvula midline 11th - shoulder shrug symmetric 12th - tongue protrusion midline  MOTOR:  normal bulk and tone, full strength in the BUE, BLE; SUBTLE WEAKNESS IN LEFT GLUTEUS MEDIUS AND LEFT TIBIALIS ANTERIOR  SENSORY:  normal and symmetric to light touch, temperature, vibration  COORDINATION:  finger-nose-finger, fine finger movements normal  REFLEXES:  deep tendon reflexes 1+ and symmetric  GAIT/STATION:  narrow  based gait; LEFT LEG LIMPING; LEFT HIP DROPS WITH GAIT; SLIGHT DIFF WITH HEEL GAIT ON LEFT     DIAGNOSTIC DATA (LABS, IMAGING, TESTING) - I reviewed patient records, labs, notes, testing and imaging myself where available.  No results found for: WBC, HGB, HCT, MCV, PLT No results found for: NA, K, CL, CO2, GLUCOSE, BUN, CREATININE, CALCIUM, PROT, ALBUMIN, AST, ALT, ALKPHOS, BILITOT, GFRNONAA, GFRAA No results found for: CHOL, HDL, LDLCALC, LDLDIRECT, TRIG, CHOLHDL No results found for: HGBA1C No results found for: VITAMINB12 No results found for: TSH   04/23/21 MRI lumbar spine [I reviewed images myself and agree with interpretation. Also mild-moderate spinal stenosis at L3-4. -VRP]  1. 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 Same numbering as on the prior MRI. 2. Similar versus slightly progressed moderate to severe left foraminal stenosis at L5-S1. 3. Similar moderate right greater than left foraminal stenosis at L3-L4. 4. Similar mild bilateral foraminal stenosis at L2-L3.    ASSESSMENT AND PLAN  71 y.o. year old female here with chronic left leg gait difficulty since 2003, with  current limping gait on left leg, mainly related to weakness of left gluteus medius and left tibialis anterior.  These are likely related to chronic left L5 radiculopathies.   Dx:  1. Left leg weakness     PLAN:  Left leg weakness (multi-factorial --> chronic left L5 radiculopathy; lumbar scoliosis; hip arthritis; left knee arthritis) - refer to PT exercises; reviewed importance of sleep, protein intake, exercise to help with strength training - encouraged patient to try body weight calisthenics, yoga, and gradual return to pickleball  Orders Placed This Encounter  Procedures   Ambulatory referral to Physical Therapy   Return for return to PCP.    Penni Bombard, MD 1/61/0960, 4:54 AM Certified in Neurology, Neurophysiology and Neuroimaging  [facility] [address], [place], [place] [address] [phone]

## 2021-08-04 ENCOUNTER — Other Ambulatory Visit: Payer: Medicare HMO

## 2021-08-09 ENCOUNTER — Ambulatory Visit: Payer: Medicare HMO | Attending: Diagnostic Neuroimaging | Admitting: Physical Therapy

## 2021-08-09 ENCOUNTER — Other Ambulatory Visit: Payer: Self-pay

## 2021-08-09 DIAGNOSIS — M6281 Muscle weakness (generalized): Secondary | ICD-10-CM | POA: Diagnosis not present

## 2021-08-09 DIAGNOSIS — R2681 Unsteadiness on feet: Secondary | ICD-10-CM | POA: Insufficient documentation

## 2021-08-09 DIAGNOSIS — R29898 Other symptoms and signs involving the musculoskeletal system: Secondary | ICD-10-CM | POA: Insufficient documentation

## 2021-08-09 DIAGNOSIS — R2689 Other abnormalities of gait and mobility: Secondary | ICD-10-CM | POA: Insufficient documentation

## 2021-08-10 ENCOUNTER — Encounter: Payer: Self-pay | Admitting: Physical Therapy

## 2021-08-10 NOTE — Therapy (Signed)
[facility] [address] [place], [place], [address] Phone: [phone]   Fax:  [phone]  Physical Therapy Evaluation  Patient Details  Name: Carla Kelley MRN: [id number] Date of Birth: 12-15-1950 Referring Provider (PT): Andrey Spearman, MD   Encounter Date: 08/09/2021   PT End of Session - 08/10/21 2047     Visit Number 1    Number of Visits 9    Date for PT Re-Evaluation 10/08/21    Authorization Type [employer]    Authorization Time Period 08-09-21 - 10-08-21    PT Start Time 0801    PT Stop Time 0846    PT Time Calculation (min) 45 min    Activity Tolerance Patient tolerated treatment well    Behavior During Therapy [facility] for tasks assessed/performed             Past Medical History:  Diagnosis Date   Carpal tunnel syndrome on right    Degenerative scoliosis    Left leg weakness    Low back pain    Pain    L hip, knee    Past Surgical History:  Procedure Laterality Date   CESAREAN SECTION  07/05/1979   ORTHOPEDIC SURGERY  02/2021    There were no vitals filed for this visit.        [facility] PT Assessment - 08/10/21 0001       Assessment   Medical Diagnosis L5 Radiculopathy:  Gait abnormality    Referring Provider (PT) Andrey Spearman, MD    Onset Date/Surgical Date --   approx. 10 yrs ago; Referral date 07-26-21   Prior Therapy last year at [facility] ([place])      Precautions   Precautions Fall      Restrictions   Weight Bearing Restrictions No      Balance Screen   Has the patient fallen in the past 6 months Yes    How many times? 2    Has the patient had a decrease in activity level because of a fear of falling?  Yes   much more cautious   Is the patient reluctant to leave their home because of a fear of falling?  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      Sensation   Light Touch --   numbness in posterior Lt knee when fatigued     ROM / Strength   AROM / PROM / Strength Strength      Strength   Overall Strength Comments RLE is WFL's    Strength Assessment Site Hip;Knee;Ankle    Right/Left Hip Left    Left Hip Flexion 3+/5    Left Hip Extension 3-/5    Left Hip ABduction 3+/5    Right/Left Knee Left    Left Knee Flexion 3+/5    Left Knee Extension 4/5    Right/Left Ankle Left    Left Ankle Dorsiflexion 4-/5    Left Ankle Plantar Flexion 2+/5      Transfers   Transfers Sit to Stand;Stand to Sit    Sit to Stand 6: Modified independent (Device/Increase time)    Five time sit to stand comments  14.66 secs from chair - no UE support  Stand to Sit 6: Modified independent (Device/Increase time)      Ambulation/Gait   Ambulation/Gait Yes    Ambulation/Gait Assistance 6: Modified independent (Device/Increase time)    Ambulation Distance (Feet) 100 Feet    Assistive device None    Gait Pattern Trendelenburg;Step-through pattern;Decreased dorsiflexion - left    Ambulation Surface Level;Indoor    Gait velocity 11.72 secs = 2.80 ft/sec      Balance   Balance Assessed Yes      Static Standing Balance   Static Standing Balance -  Activities  Single Leg Stance - Right Leg;Single Leg Stance - Left Leg    Static Standing - Comment/# of Minutes L SLS 2.13 secs:  R SLS 4.69 secs      Standardized Balance Assessment   Standardized Balance Assessment Timed Up and Go Test      Timed Up and Go Test   TUG Normal TUG    Normal TUG (seconds) 13.03                        Objective measurements completed on examination: See above findings.       Access Code: [email or web address] URL: [email or web address] Date: 08/10/2021 Prepared by: Ethelene Browns  Exercises Clamshell with Resistance - 1 x daily - 7 x weekly - 2 sets - 10 reps - 3 sec hold Sidelying  Hip Abduction with Resistance at Thighs - 1 x daily - 7 x weekly - 2 sets - 10 reps - 3 hold Hooklying Clamshell with Resistance - 1 x daily - 7 x weekly - 2 sets - 10 reps Supine Hip Flexion with Resistance - 1 x daily - 7 x weekly - 2 sets - 10 reps Heel Raises with Counter Support - 1 x daily - 7 x weekly - 2 sets - 10 reps          PT Education - 08/10/21 2046     Education Details eval results - POC with ELOS 6 weeks; HEP [employer]    Person(s) Educated Patient    Methods Explanation    Comprehension Verbalized understanding              PT Short Term Goals - 08/10/21 2108       PT SHORT TERM GOAL #1   Title Independent in HEP for LLE strengthening and balance exercises.    Time 4    Period Weeks    Status New    Target Date 09/10/21      PT SHORT TERM GOAL #2   Title Pt will improve 5x sit to stand to </= 12 secs without UE support from standard chair.    Baseline 14.66 secs    Time 4    Period Weeks    Status New    Target Date 09/10/21      PT SHORT TERM GOAL #3   Title Increase LLE SLS to >/= 4 secs to demo improved balance and strength LLE.    Baseline 2.13 secs    Time 4    Period Weeks    Status New    Target Date 09/10/21               PT Long Term Goals - 08/10/21 2113       PT LONG TERM GOAL #1   Title Pt will be independent in updated HEP for LLE strengthening.    Time 8    Period Weeks    Status New  Target Date 10/08/21      PT LONG TERM GOAL #2   Title Improve TUG score to </= 11 secs to demonstrate improved functional mobility.    Baseline 13.03 secs    Time 8    Period Weeks    Status New    Target Date 10/08/21      PT LONG TERM GOAL #3   Title Improve gait velocity to >/= 3.2 ft/sec without device for increased gait efficiency.    Baseline 11.72 secs = 2.8 ft/sec    Time 8    Period Weeks    Status New    Target Date 10/08/21      PT LONG TERM GOAL #4   Title Pt will subjectively report at least 25%  improvement in gait with less limping.    Time 8    Period Weeks    Status New    Target Date 10/08/21                    Plan - 08/10/21 2050     Clinical Impression Statement Pt is a 71 yr old lady with L5 radiculopathy resulting in LLE weakness and gait deviations.  Pt reports LLE weakness since approx. 2003; pt presents with Trendelenburg gait due to Lt hip gluteus medius weakness and mild Lt decreased foot clearance due to weak dorsiflexors.  Pt's TUG score = 13.03 secs (no significant fall risk as score < 13.5 secs); Pt will benefit from PT to address LLE weakness, gait and balance deficits.    Personal Factors and Comorbidities Comorbidity 2;Time since onset of injury/illness/exacerbation;Past/Current Experience    Comorbidities degenerative scoliosis, L5 radiculopathy, Lt torn meniscus, s/p CTS RUE    Examination-Activity Limitations Stairs;Squat;Locomotion Level;Stand;Transfers    Examination-Participation Restrictions Cleaning;Community Activity;Shop;Volunteer;Yard Work    [employer] Evolving/Moderate complexity    Clinical Decision Making Moderate    Rehab Potential Good    PT Frequency 1x / week    PT Duration 8 weeks    PT Treatment/Interventions ADLs/Self Care Home Management;Gait training;Stair training;Therapeutic activities;Therapeutic exercise;Patient/family education;Balance training;Neuromuscular re-education;Orthotic Fit/Training    PT Next Visit Plan check HEP issued on 08-09-21 - [place] [id number]    Consulted and Agree with Plan of Care Patient             Patient will benefit from skilled therapeutic intervention in order to improve the following deficits and impairments:  Abnormal gait, Decreased balance, Decreased strength, Impaired sensation  Visit Diagnosis: Muscle weakness (generalized) - Plan: PT plan of care cert/re-cert  Other abnormalities of gait and mobility - Plan: PT plan of  care cert/re-cert  Unsteadiness on feet - Plan: PT plan of care cert/re-cert     Problem List There are no problems to display for this patient.   Alda Lea, PT 08/10/2021, 9:26 PM  [facility] [address] [place], [place], [address] Phone: [phone]   Fax:  [phone]  Name: Carla Kelley MRN: [id number] Date of Birth: 1951/05/05

## 2021-08-16 DIAGNOSIS — K52831 Collagenous colitis: Secondary | ICD-10-CM | POA: Diagnosis not present

## 2021-08-16 DIAGNOSIS — Z1211 Encounter for screening for malignant neoplasm of colon: Secondary | ICD-10-CM | POA: Diagnosis not present

## 2021-08-19 DIAGNOSIS — J0110 Acute frontal sinusitis, unspecified: Secondary | ICD-10-CM | POA: Diagnosis not present

## 2021-08-30 ENCOUNTER — Ambulatory Visit: Payer: Medicare HMO | Admitting: Physical Therapy

## 2021-09-06 ENCOUNTER — Ambulatory Visit: Payer: Medicare HMO | Admitting: Physical Therapy

## 2021-09-09 DIAGNOSIS — Z6823 Body mass index (BMI) 23.0-23.9, adult: Secondary | ICD-10-CM | POA: Diagnosis not present

## 2021-09-09 DIAGNOSIS — Z01419 Encounter for gynecological examination (general) (routine) without abnormal findings: Secondary | ICD-10-CM | POA: Diagnosis not present

## 2021-09-09 DIAGNOSIS — M8588 Other specified disorders of bone density and structure, other site: Secondary | ICD-10-CM | POA: Diagnosis not present

## 2021-09-13 ENCOUNTER — Ambulatory Visit: Payer: Medicare HMO | Admitting: Physical Therapy

## 2021-09-21 DIAGNOSIS — M79604 Pain in right leg: Secondary | ICD-10-CM | POA: Diagnosis not present

## 2021-10-20 DIAGNOSIS — R7303 Prediabetes: Secondary | ICD-10-CM | POA: Diagnosis not present

## 2021-10-20 DIAGNOSIS — R110 Nausea: Secondary | ICD-10-CM | POA: Diagnosis not present

## 2021-10-20 DIAGNOSIS — R55 Syncope and collapse: Secondary | ICD-10-CM | POA: Diagnosis not present

## 2021-10-20 DIAGNOSIS — M7989 Other specified soft tissue disorders: Secondary | ICD-10-CM | POA: Diagnosis not present

## 2021-10-20 DIAGNOSIS — R5381 Other malaise: Secondary | ICD-10-CM | POA: Diagnosis not present

## 2021-10-20 DIAGNOSIS — G2581 Restless legs syndrome: Secondary | ICD-10-CM | POA: Diagnosis not present

## 2021-10-21 DIAGNOSIS — H2513 Age-related nuclear cataract, bilateral: Secondary | ICD-10-CM | POA: Diagnosis not present

## 2021-10-21 DIAGNOSIS — H43813 Vitreous degeneration, bilateral: Secondary | ICD-10-CM | POA: Diagnosis not present

## 2021-10-21 DIAGNOSIS — H5213 Myopia, bilateral: Secondary | ICD-10-CM | POA: Diagnosis not present

## 2021-10-28 ENCOUNTER — Ambulatory Visit
Admission: RE | Admit: 2021-10-28 | Discharge: 2021-10-28 | Disposition: A | Discharge status: Home / Self Care | Payer: Medicare HMO | Source: Ambulatory Visit | Attending: Family Medicine | Admitting: Family Medicine

## 2021-10-28 ENCOUNTER — Other Ambulatory Visit: Payer: Self-pay | Admitting: Family Medicine

## 2021-10-28 DIAGNOSIS — R2243 Localized swelling, mass and lump, lower limb, bilateral: Secondary | ICD-10-CM

## 2021-10-28 DIAGNOSIS — Z0389 Encounter for observation for other suspected diseases and conditions ruled out: Secondary | ICD-10-CM | POA: Diagnosis not present

## 2021-10-28 IMAGING — US US EXTREM LOW VENOUS
1 series · 13 of 24 positions shown · non-contrast
Comparison: None.

CLINICAL DATA: Rule out DVT.



[Series 1: us extrem low venous · 0.06mm/px · 13 of 55 slices shown]
[im 1/55]
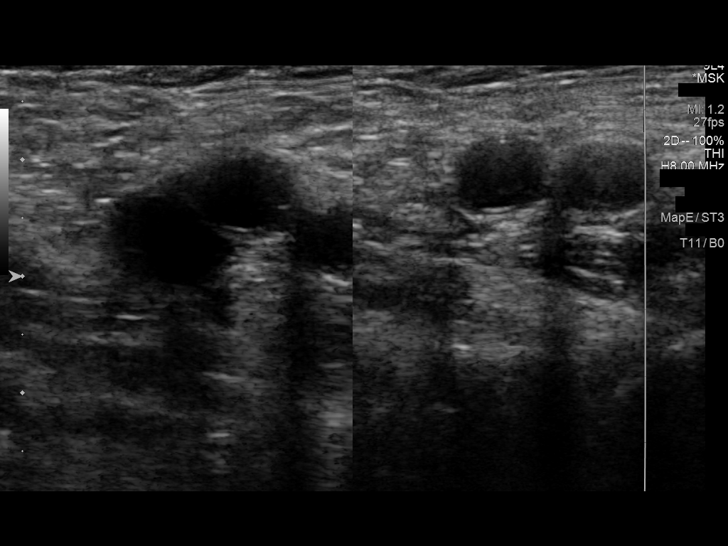
[im 5/55]
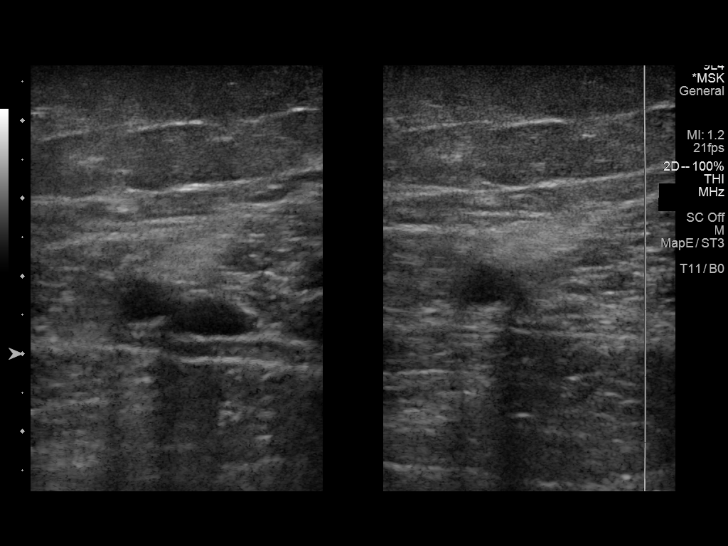
[im 10/55]
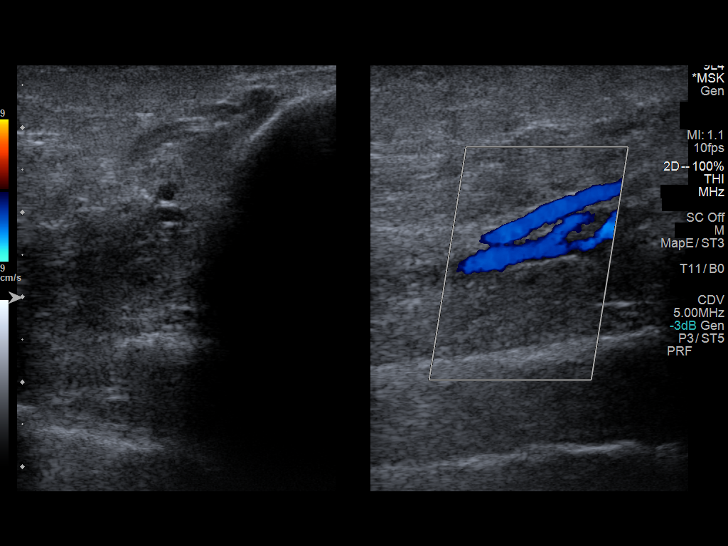
[im 15/55]
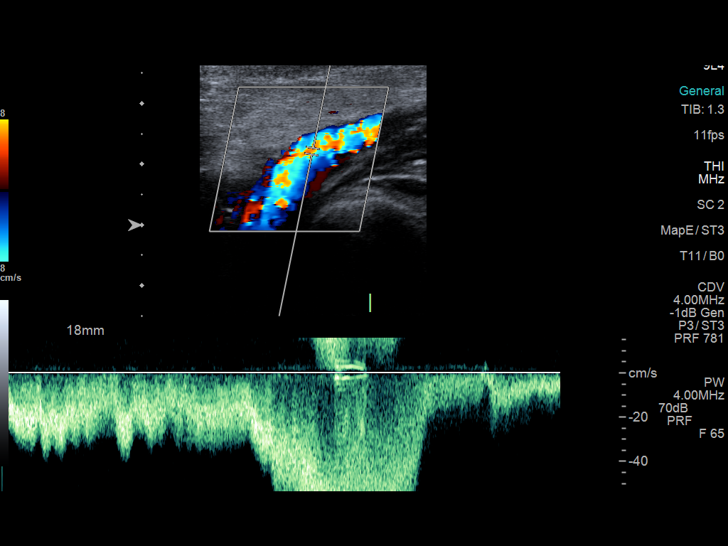
[im 19/55]
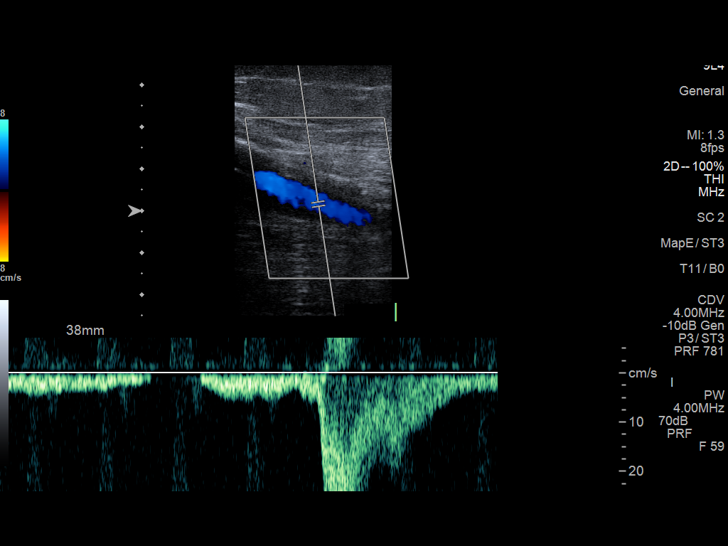
[im 24/55]
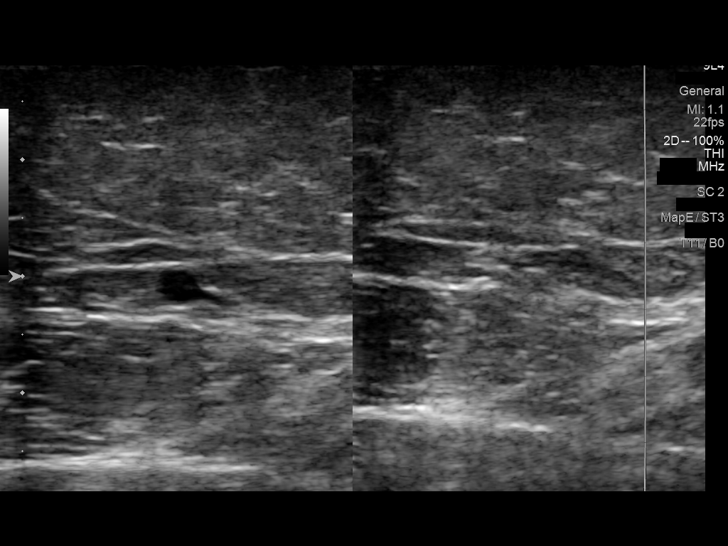
[im 29/55]
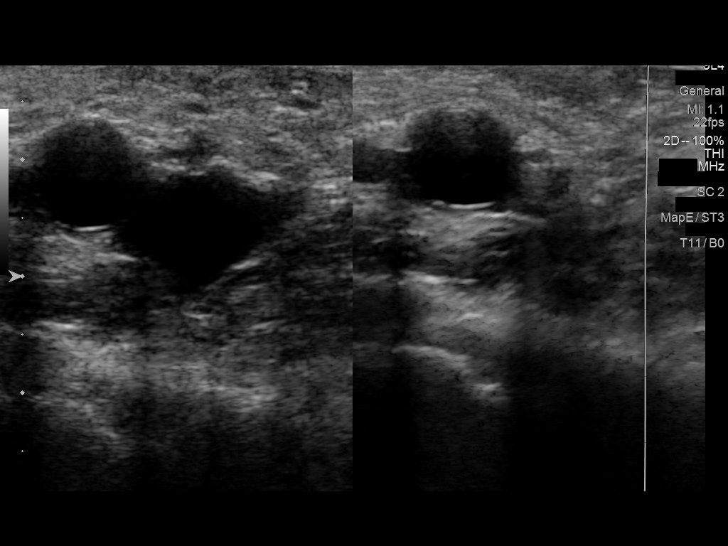
[im 31/55]
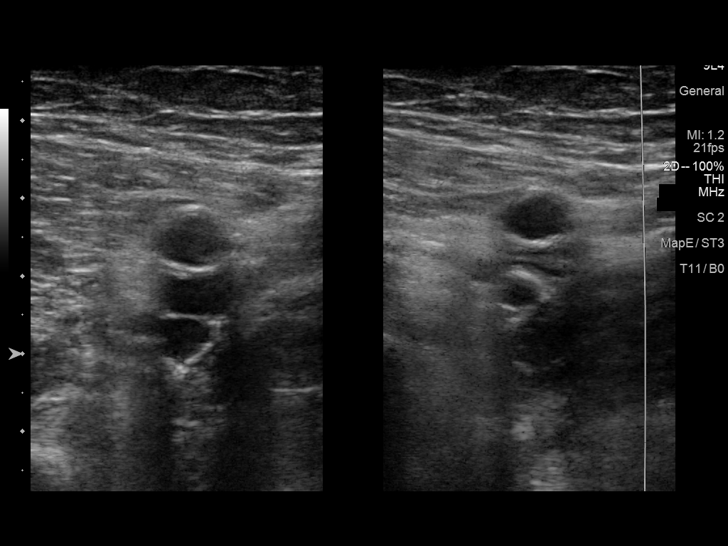
[im 36/55]
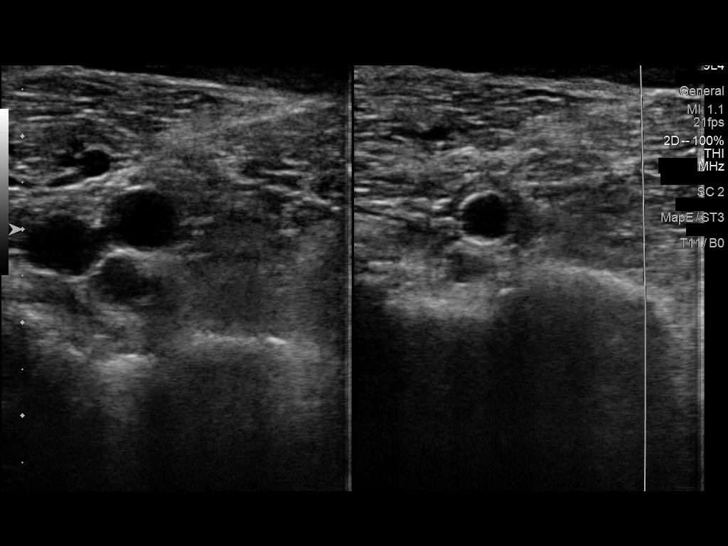
[im 40/55]
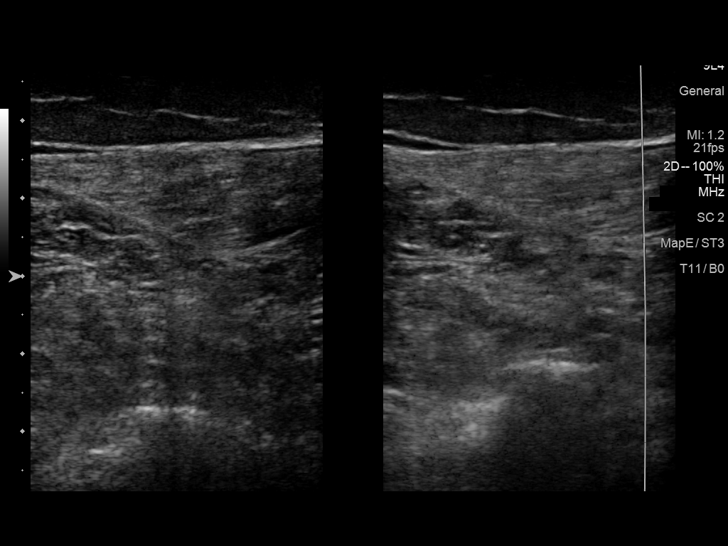
[im 45/55]
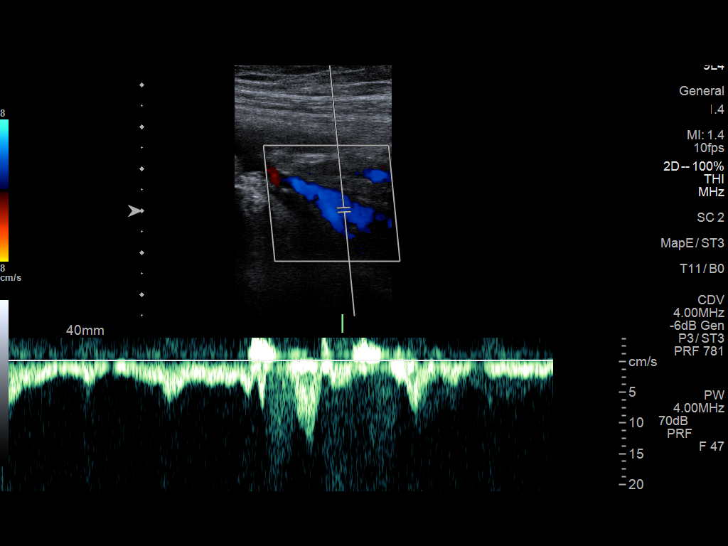
[im 50/55]
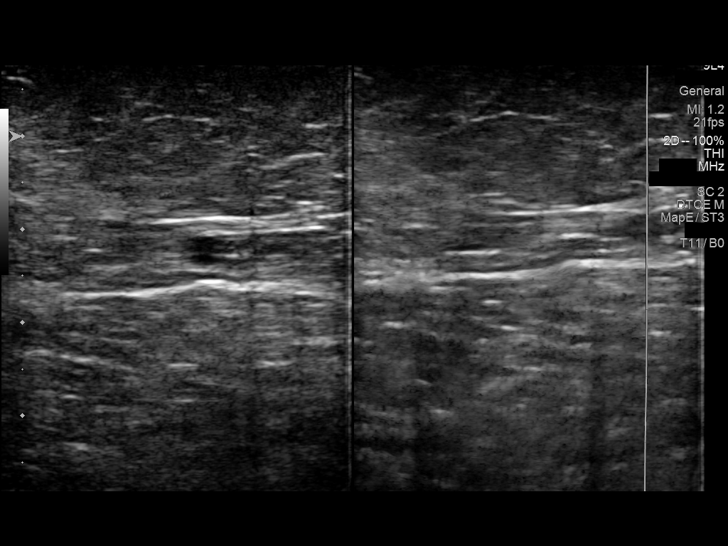
[im 55/55]
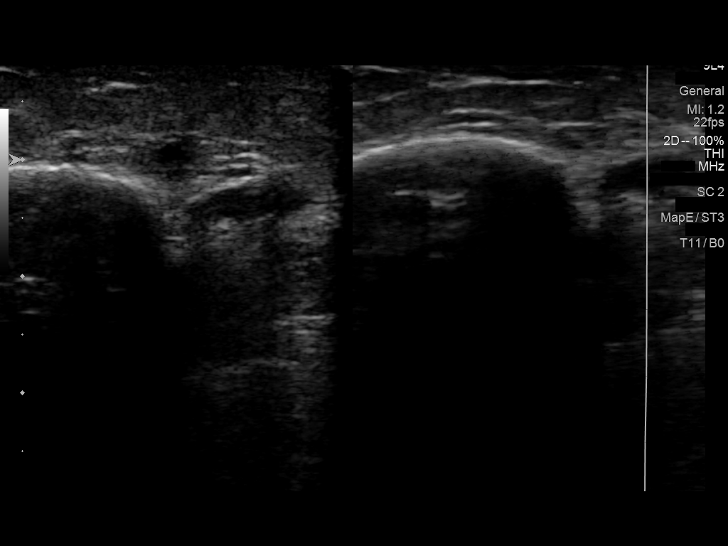

[13 of 24 positions shown; findings below may reference images not displayed]

FINDINGS: RIGHT LOWER EXTREMITY

Common Femoral Vein: No evidence of thrombus. Normal
compressibility, respiratory phasicity and response to augmentation.

Saphenofemoral Junction: No evidence of thrombus. Normal
compressibility and flow on color Doppler imaging.

Profunda Femoral Vein: No evidence of thrombus. Normal
compressibility and flow on color Doppler imaging.

Femoral Vein: No evidence of thrombus. Normal compressibility,
respiratory phasicity and response to augmentation.

Popliteal Vein: No evidence of thrombus. Normal compressibility,
respiratory phasicity and response to augmentation.

Calf Veins: No evidence of thrombus. Normal compressibility and flow
on color Doppler imaging.

Superficial Great Saphenous Vein: No evidence of thrombus. Normal
compressibility.

Venous Reflux:  None.

Other Findings:  None.

LEFT LOWER EXTREMITY

Common Femoral Vein: No evidence of thrombus. Normal
compressibility, respiratory phasicity and response to augmentation.

Saphenofemoral Junction: No evidence of thrombus. Normal
compressibility and flow on color Doppler imaging.

Profunda Femoral Vein: No evidence of thrombus. Normal
compressibility and flow on color Doppler imaging.

Femoral Vein: No evidence of thrombus. Normal compressibility,
respiratory phasicity and response to augmentation.

Popliteal Vein: No evidence of thrombus. Normal compressibility,
respiratory phasicity and response to augmentation.

Calf Veins: No evidence of thrombus. Normal compressibility and flow
on color Doppler imaging.

Superficial Great Saphenous Vein: No evidence of thrombus. Normal
compressibility.

Venous Reflux:  None.

Other Findings:  None.
IMPRESSION: No evidence of deep venous thrombosis in either lower extremity.

## 2021-11-15 ENCOUNTER — Ambulatory Visit: Payer: Medicare HMO

## 2021-11-15 ENCOUNTER — Ambulatory Visit (INDEPENDENT_AMBULATORY_CARE_PROVIDER_SITE_OTHER): Payer: Medicare HMO

## 2021-11-15 ENCOUNTER — Ambulatory Visit: Payer: Medicare HMO | Admitting: Podiatry

## 2021-11-15 ENCOUNTER — Encounter: Payer: Self-pay | Admitting: Podiatry

## 2021-11-15 DIAGNOSIS — M2142 Flat foot [pes planus] (acquired), left foot: Secondary | ICD-10-CM

## 2021-11-15 DIAGNOSIS — M778 Other enthesopathies, not elsewhere classified: Secondary | ICD-10-CM | POA: Diagnosis not present

## 2021-11-15 DIAGNOSIS — M76821 Posterior tibial tendinitis, right leg: Secondary | ICD-10-CM

## 2021-11-15 DIAGNOSIS — M2170 Unequal limb length (acquired), unspecified site: Secondary | ICD-10-CM

## 2021-11-15 NOTE — Progress Notes (Signed)
SITUATION ?Reason for Consult: Evaluation for Bilateral Custom Foot Orthoses ?Patient / Caregiver Report: Patient is ready for foot orthotics ? ?OBJECTIVE DATA: ?Patient History / Diagnosis:  ?  ICD-10-CM   ?1. Pes planus of both feet  M21.41   ? M21.42   ?  ?2. Capsulitis of foot, right  M77.8   ?  ?3. Posterior tibial tendinitis of right lower extremity  M76.821   ?  ? ? ?Current or Previous Devices:   Current user ? ?Foot Examination: ?Skin presentation:   Intact ?Ulcers & Callousing:   None ?Toe / Foot Deformities:  Pes planus ?Weight Bearing Presentation:  Planus ?Sensation:    Intact ? ?Shoe Size:    34M ? ?ORTHOTIC RECOMMENDATION ?Recommended Device: 1x pair of custom functional foot orthotics ? ?GOALS OF ORTHOSES ?- Reduce Pain ?- Prevent Foot Deformity ?- Prevent Progression of Further Foot Deformity ?- Relieve Pressure ?- Improve the Overall Biomechanical Function of the Foot and Lower Extremity. ? ?ACTIONS PERFORMED ?Potential out of pocket cost was communicated to patient. Patient understood and consent to casting. Patient was casted for Foot Orthoses via crush box. Procedure was explained and patient tolerated procedure well. Casts were shipped to central fabrication. All questions were answered and concerns addressed. ? ?PLAN ?Patient is to be called for fitting when devices are ready.  ? ? ?

## 2021-11-17 NOTE — Progress Notes (Signed)
Subjective:  ? ?Patient ID: Carla Kelley, female   DOB: 72 y.o.   MRN: [id number]  ? ?HPI ?Patient presents stating that she needs new inserts and she started to develop inflammation and pain around her right inside ankle that makes it hard for her to walk ? ? ?ROS ? ? ?   ?Objective:  ?Physical Exam  ?Neurovascular status intact with inflammation pain of the posterior tibial tendon right as it inserts into the navicular with flatfoot deformity noted ? ?   ?Assessment:  ?Inflammatory tendinitis posterior tib no indications currently that there is a tear of the tendon based on muscle strength testing but there is significant flatfoot deformity which is contributory ? ?   ?Plan:  ?H&P reviewed condition educated on flatfoot deformity and posterior tibial tendinitis.  Today I went ahead and I did discuss injection but she denies wanting this even though it may be necessary due to acute inflammation but working to try a fascial brace to hold the arch up along with oral anti-inflammatory ice therapy supportive shoes and I casted for new functional orthotics.  Patient will be seen back to recheck when orthotics return and may require further treatment or possible immobilization ? ?X-rays indicate significant flatfoot deformity right with the arch being completely depressed and multiple signs of instability of the subtalar joint ?   ? ? ?

## 2021-11-24 ENCOUNTER — Encounter: Payer: Self-pay | Admitting: Podiatry

## 2021-11-24 ENCOUNTER — Ambulatory Visit: Payer: Medicare HMO | Admitting: Podiatry

## 2021-11-24 DIAGNOSIS — M76821 Posterior tibial tendinitis, right leg: Secondary | ICD-10-CM | POA: Diagnosis not present

## 2021-11-24 MED ORDER — DEXAMETHASONE SODIUM PHOSPHATE 120 MG/30ML IJ SOLN
2.0000 mg | Freq: Once | INTRAMUSCULAR | Status: AC
  Administered 2021-11-24: 2 mg via INTRA_ARTICULAR

## 2021-11-24 MED ORDER — METHYLPREDNISOLONE 4 MG PO TBPK: ORAL_TABLET | ORAL | 0 refills | Status: DC

## 2021-11-24 NOTE — Progress Notes (Signed)
She presents today for follow-up of her posterior tibial tendinitis of her right foot.  States that she is on diclofenac and she was just scanned for orthotics she states that is still really so painful and swollen I can barely walk on it.  She states that it feels like it is just giving away.  Objective: Vital signs are stable alert and oriented x3 inversion against resistance is exquisitely tender and painful for her.  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  She has not had any trauma that she knows of.  She states that she does have some tenderness on the proximal portion of the tendon posterior to the medial malleolus.  Flexible pes planus.  She walks with a severely antalgic gait.  Assessment posterior tibial tendinitis posterior tibial tendon dysfunction with pes planus.  Cannot rule out a tear of the posterior tibial tendon.  Plan: Discussed etiology pathology conservative surgical therapies at this point placing her in a cam boot after a subcutaneous injection overlying the area with 5 mg of Kenalog and local anesthetic.  She will also start methylprednisolone and then restart her diclofenac.  She will maintain the use of the cam walker at all times.  I will follow-up with her in 1 month.  If not improved considerably MRI be necessary.

## 2021-12-01 ENCOUNTER — Ambulatory Visit: Payer: Medicare HMO | Admitting: Podiatry

## 2021-12-06 ENCOUNTER — Encounter: Payer: Self-pay | Admitting: Podiatry

## 2021-12-06 ENCOUNTER — Ambulatory Visit: Payer: Medicare HMO | Admitting: Podiatry

## 2021-12-06 DIAGNOSIS — M76821 Posterior tibial tendinitis, right leg: Secondary | ICD-10-CM

## 2021-12-06 DIAGNOSIS — M66871 Spontaneous rupture of other tendons, right ankle and foot: Secondary | ICD-10-CM | POA: Diagnosis not present

## 2021-12-06 DIAGNOSIS — Q666 Other congenital valgus deformities of feet: Secondary | ICD-10-CM | POA: Diagnosis not present

## 2021-12-06 NOTE — Progress Notes (Signed)
She presents today for follow-up of her posterior tibial tendinitis of her right foot.  She states that is still a problem is still there though it is feeling better but it is still inhibiting my ability to perform her daily activities.  Objective: Vital signs are stable she is alert and oriented x3.  Pulses are palpable.  She has severe weakness on inversion against resistance of the right foot.  She has tenderness and fluctuance on palpation of the posterior tibial tendon as it courses to the navicular tuberosity area.  There is a larger soft tissue mass just superior to its insertion site on the navicular tuberosity possibly ganglion or inflammatory process.  Assessment: Probable tear or rupture of the posterior tibial tendon right ankle  Plan: Conservative therapies have failed to alleviate the symptoms from this patient.  At this point I feel an MRI is necessary for surgical evaluation and differential diagnoses.

## 2021-12-10 ENCOUNTER — Ambulatory Visit
Admission: RE | Admit: 2021-12-10 | Discharge: 2021-12-10 | Disposition: A | Discharge status: Home / Self Care | Payer: Medicare HMO | Source: Ambulatory Visit | Attending: Podiatry | Admitting: Podiatry

## 2021-12-10 DIAGNOSIS — M66871 Spontaneous rupture of other tendons, right ankle and foot: Secondary | ICD-10-CM

## 2021-12-10 DIAGNOSIS — M7989 Other specified soft tissue disorders: Secondary | ICD-10-CM | POA: Diagnosis not present

## 2021-12-10 DIAGNOSIS — M19071 Primary osteoarthritis, right ankle and foot: Secondary | ICD-10-CM | POA: Diagnosis not present

## 2021-12-10 DIAGNOSIS — S93401A Sprain of unspecified ligament of right ankle, initial encounter: Secondary | ICD-10-CM | POA: Diagnosis not present

## 2021-12-10 DIAGNOSIS — M7731 Calcaneal spur, right foot: Secondary | ICD-10-CM | POA: Diagnosis not present

## 2021-12-10 DIAGNOSIS — Q666 Other congenital valgus deformities of feet: Secondary | ICD-10-CM

## 2021-12-10 IMAGING — MR MR ANKLE*R* W/O CM
5 series · 40 of 40 positions shown · non-contrast
Comparison: None Available.

CLINICAL DATA: Ankle pain and swelling for 4 months.

EXAM:
MRI OF THE RIGHT ANKLE WITHOUT CONTRAST
TECHNIQUE: Multiplanar, multisequence MR imaging of the ankle was performed. No
intravenous contrast was administered.

[Series 5: T2 fat-sat · axial · 3.0mm · 0.50mm/px · z∈[-88,+40]mm · 8 of 34 slices shown (1 of 2)]
[im 1/34]
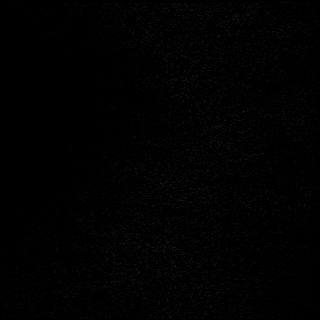
[im 5/34]
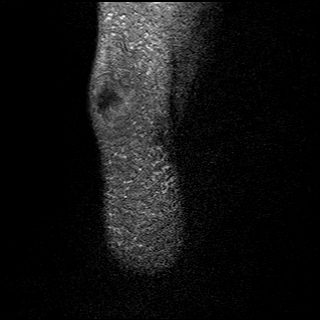
[im 10/34]
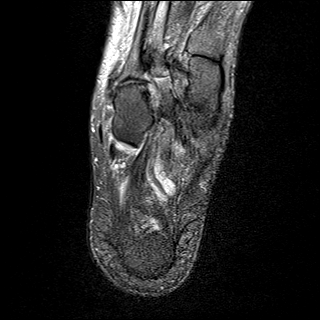
[im 15/34]
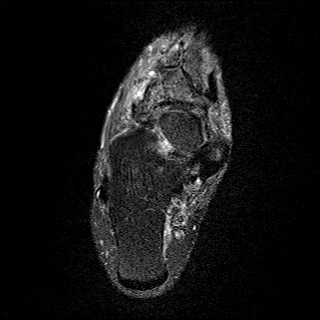
[im 19/34]
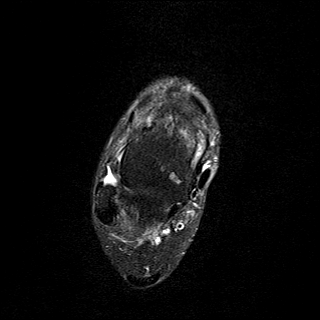
[im 24/34]
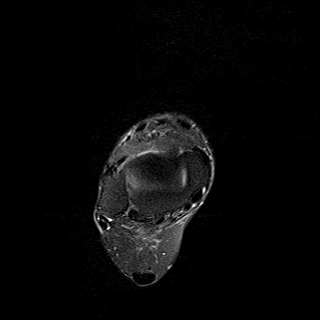
[im 29/34]
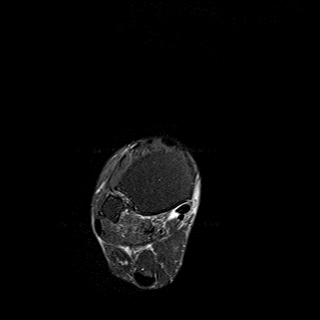
[im 34/34]
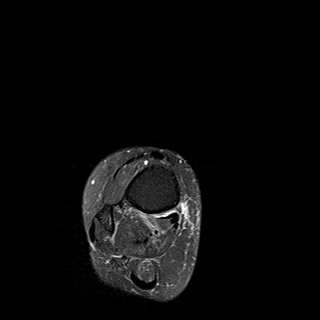

[Series 6: PD fat-sat · axial · 3.0mm · 0.50mm/px · z∈[-88,+40]mm · 9 of 34 slices shown]
[im 1/34]
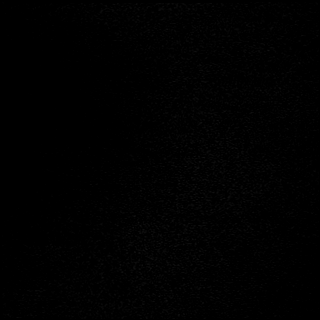
[im 5/34]
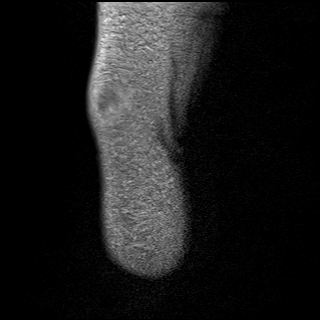
[im 9/34]
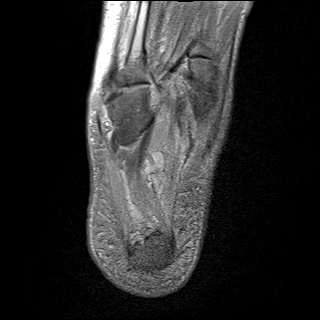
[im 13/34]
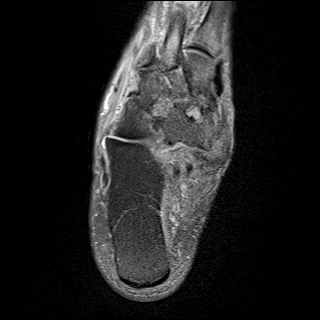
[im 17/34]
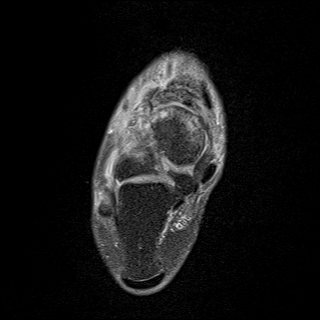
[im 21/34]
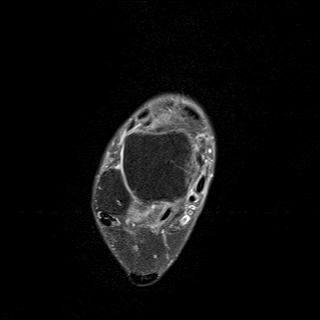
[im 25/34]
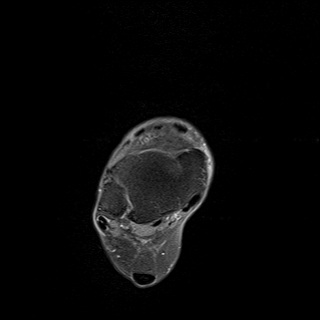
[im 29/34]
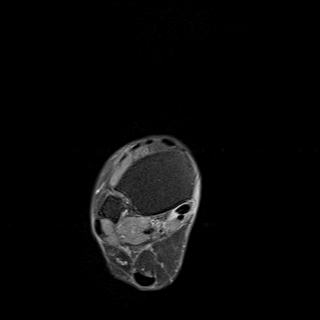
[im 34/34]
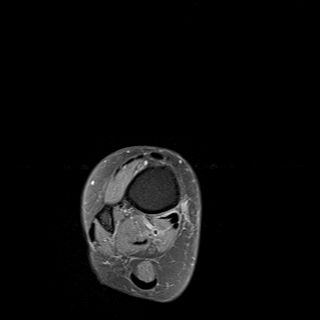

[Series 7: T1 · sagittal · 4.0mm · 0.62mm/px · 6 of 22 slices shown]
[im 1/22]
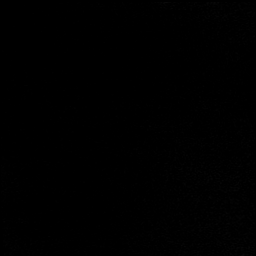
[im 5/22]
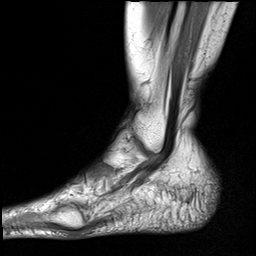
[im 9/22]
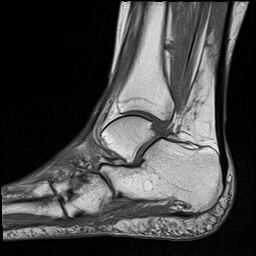
[im 13/22]
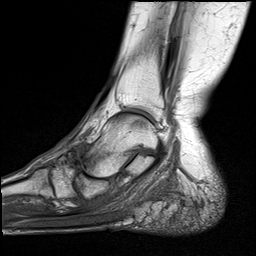
[im 17/22]
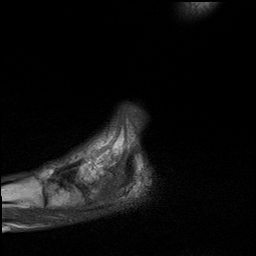
[im 22/22]
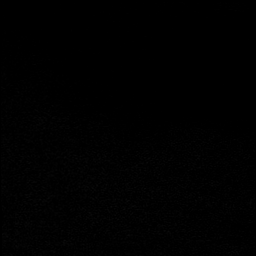

[Series 8: STIR · sagittal · 4.0mm · 0.35mm/px · 6 of 22 slices shown]
[im 1/22]
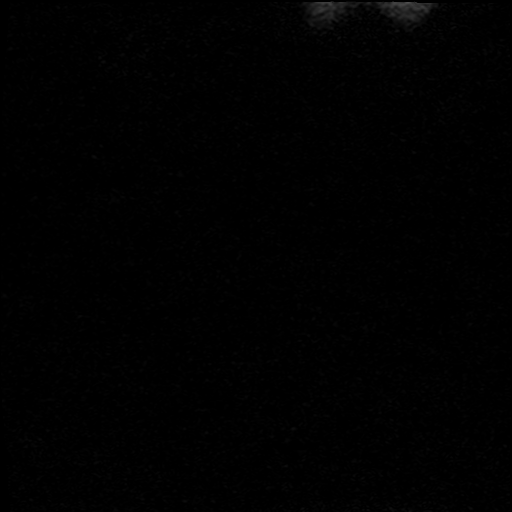
[im 5/22]
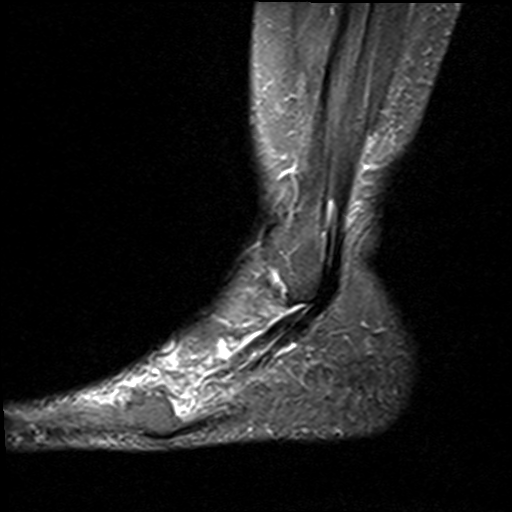
[im 9/22]
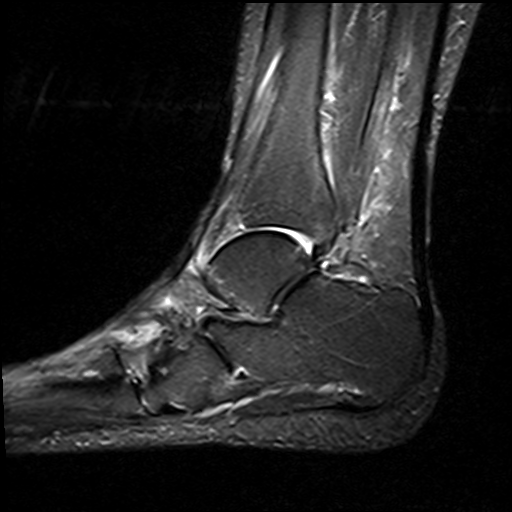
[im 13/22]
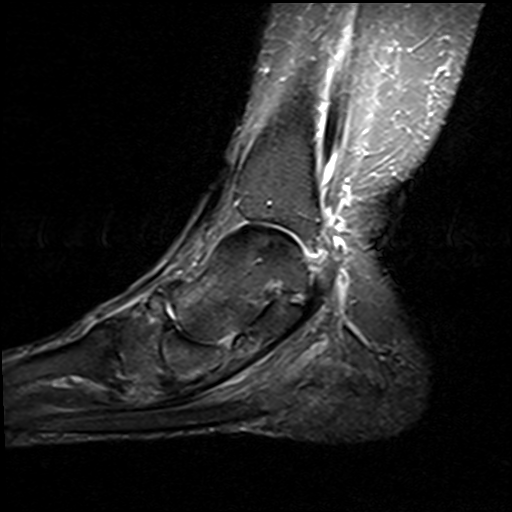
[im 17/22]
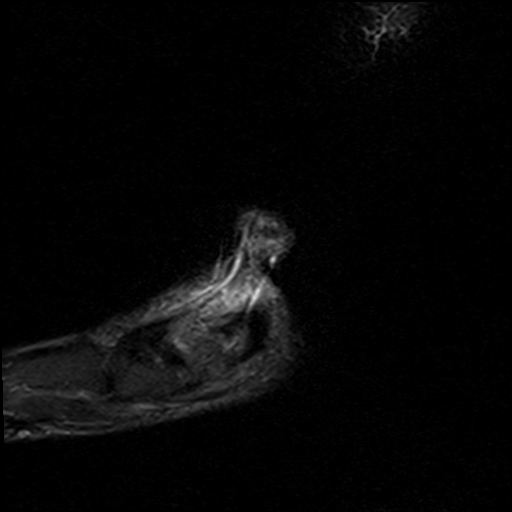
[im 22/22]
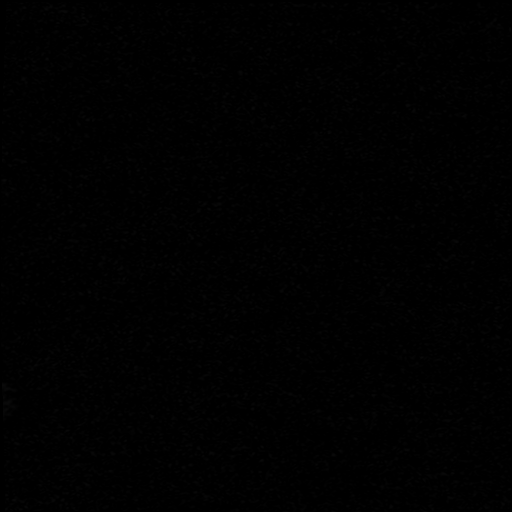

[Series 9: T2 fat-sat · coronal · 3.0mm · 0.62mm/px · 11 of 40 slices shown (2 of 2)]
[im 1/40]
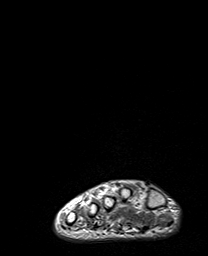
[im 4/40]
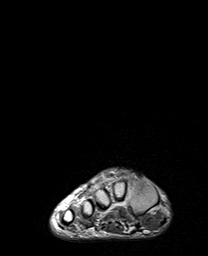
[im 8/40]
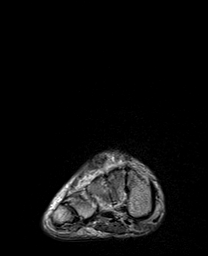
[im 12/40]
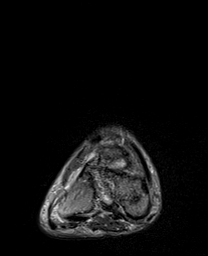
[im 16/40]
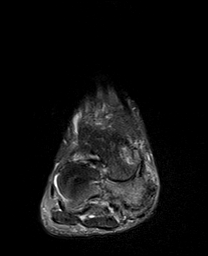
[im 20/40]
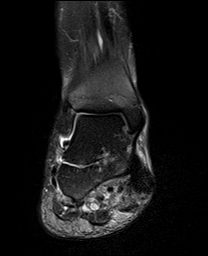
[im 24/40]
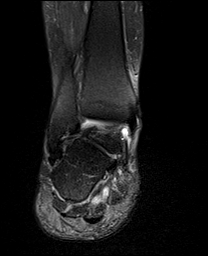
[im 28/40]
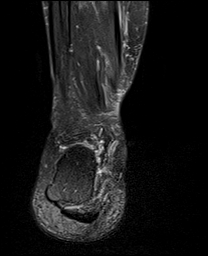
[im 32/40]
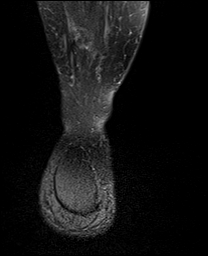
[im 36/40]
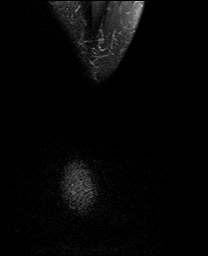
[im 40/40]
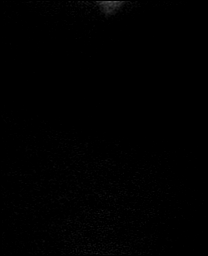

[40 of 40 positions shown; findings below may reference images not displayed]

FINDINGS: TENDONS

Peroneal: Mild tendinosis of the peroneus longus. Mild tendinosis of
the peroneus brevis just distal to the lateral malleolus.

Posteromedial: Posterior tibial tendon intact. Flexor hallucis
longus tendon intact. Flexor digitorum longus tendon intact.

Anterior: Tibialis anterior tendon intact. Extensor hallucis longus
tendon intact Extensor digitorum longus tendon intact.

Achilles:  Intact.

Plantar Fascia: Intact.

LIGAMENTS

Lateral: Complete chronic tear of the anterior talofibular ligament.
Calcaneofibular ligament intact. Posterior talofibular ligament
intact. Anterior and posterior tibiofibular ligaments intact.

Medial: Deltoid ligament intact. Spring ligament intact.

CARTILAGE

Ankle Joint: No joint effusion. Normal ankle mortise. No chondral
defect.

Subtalar Joints/Sinus Tarsi: Normal subtalar joints. No subtalar
joint effusion. Normal sinus tarsi.

Bones: No aggressive osseous lesion. No fracture or dislocation.
Small plantar calcaneal spur. Severe advanced osteoarthritis of the
talonavicular joint with inferior subluxation of the navicular and
fragmentation along the dorsal aspect of the navicular and with
subchondral marrow edema. Severe advanced osteoarthritis of the
navicular-medial cuneiform, navicular-middle cuneiform, and
navicular-lateral cuneiform joints with subchondral marrow edema.
Mild osteoarthritis of the second, third and fourth tarsometatarsal
joints. Mild osteoarthritis of the calcaneocuboid joint.

Soft Tissue: No fluid collection or hematoma. Muscles are normal
without edema or atrophy. Tarsal tunnel is normal.
IMPRESSION: 1. Mild tendinosis of the peroneus longus.
2. Mild tendinosis of the peroneus brevis just distal to the lateral
malleolus.
3. Severe advanced osteoarthritis of the talonavicular joint with
inferior subluxation of the navicular and fragmentation along the
dorsal aspect of the navicular. Severe advanced osteoarthritis of
the navicular-medial cuneiform, navicular-middle cuneiform, and
navicular-lateral cuneiform joints. This appearance can be seen with
Charcot arthropathy .
4. Mild osteoarthritis of the second, third and fourth
tarsometatarsal joints.
5. Mild osteoarthritis of the calcaneocuboid joint.

## 2021-12-13 ENCOUNTER — Telehealth: Payer: Self-pay | Admitting: Podiatry

## 2021-12-13 NOTE — Telephone Encounter (Signed)
Patient would like someone to call her with the results of her MRI , she does not want to come in for an appointment to get them.  Please call

## 2021-12-13 NOTE — Telephone Encounter (Signed)
Please advise 

## 2021-12-14 NOTE — Telephone Encounter (Signed)
Pt is calling back for MRI results...  Please advise

## 2021-12-22 ENCOUNTER — Ambulatory Visit: Payer: Medicare HMO | Admitting: Podiatry

## 2021-12-22 ENCOUNTER — Ambulatory Visit (INDEPENDENT_AMBULATORY_CARE_PROVIDER_SITE_OTHER): Payer: Medicare HMO

## 2021-12-22 DIAGNOSIS — M2141 Flat foot [pes planus] (acquired), right foot: Secondary | ICD-10-CM

## 2021-12-22 DIAGNOSIS — M2142 Flat foot [pes planus] (acquired), left foot: Secondary | ICD-10-CM

## 2021-12-22 NOTE — Progress Notes (Signed)
Patient in office for re-measurement for custom orthotics. Patient has return the orthotics that were not made the way she usually has them made.   Patient is 5 foot 1in tall, 124lbs. Patient foot measured as a size 9.5 women's medium width. Patient will mainly be wearing custom inserts in athletic shoes.   Dr. Milinda Pointer is the treating provider.    Advised patient the office will notify when orthotics are available for pick-up. Patient verbalized understanding

## 2021-12-27 ENCOUNTER — Other Ambulatory Visit: Payer: Medicare HMO

## 2022-01-03 DIAGNOSIS — M810 Age-related osteoporosis without current pathological fracture: Secondary | ICD-10-CM | POA: Diagnosis not present

## 2022-01-05 DIAGNOSIS — R52 Pain, unspecified: Secondary | ICD-10-CM | POA: Diagnosis not present

## 2022-01-05 DIAGNOSIS — R3982 Chronic bladder pain: Secondary | ICD-10-CM | POA: Diagnosis not present

## 2022-01-10 NOTE — Telephone Encounter (Signed)
Patient called back  - she said she has already spoke with Dr Posey Pronto and she does not need follow up with Dr Milinda Pointer at this time.

## 2022-01-11 DIAGNOSIS — M810 Age-related osteoporosis without current pathological fracture: Secondary | ICD-10-CM | POA: Diagnosis not present

## 2022-01-19 DIAGNOSIS — M25562 Pain in left knee: Secondary | ICD-10-CM | POA: Diagnosis not present

## 2022-01-31 DIAGNOSIS — R102 Pelvic and perineal pain: Secondary | ICD-10-CM | POA: Diagnosis not present

## 2022-01-31 DIAGNOSIS — R3982 Chronic bladder pain: Secondary | ICD-10-CM | POA: Diagnosis not present

## 2022-02-21 DIAGNOSIS — M25562 Pain in left knee: Secondary | ICD-10-CM | POA: Diagnosis not present

## 2022-02-23 ENCOUNTER — Telehealth: Payer: Self-pay | Admitting: Podiatry

## 2022-02-23 NOTE — Telephone Encounter (Signed)
Called patient left message to advise that her orthotics are in and she needs to make an appt to pickup

## 2022-03-06 DIAGNOSIS — Z681 Body mass index (BMI) 19 or less, adult: Secondary | ICD-10-CM | POA: Diagnosis not present

## 2022-03-06 DIAGNOSIS — T3695XA Adverse effect of unspecified systemic antibiotic, initial encounter: Secondary | ICD-10-CM | POA: Diagnosis not present

## 2022-03-06 DIAGNOSIS — R0981 Nasal congestion: Secondary | ICD-10-CM | POA: Diagnosis not present

## 2022-03-06 DIAGNOSIS — B379 Candidiasis, unspecified: Secondary | ICD-10-CM | POA: Diagnosis not present

## 2022-03-06 DIAGNOSIS — Z03818 Encounter for observation for suspected exposure to other biological agents ruled out: Secondary | ICD-10-CM | POA: Diagnosis not present

## 2022-03-06 DIAGNOSIS — J0140 Acute pansinusitis, unspecified: Secondary | ICD-10-CM | POA: Diagnosis not present

## 2022-03-14 ENCOUNTER — Ambulatory Visit (INDEPENDENT_AMBULATORY_CARE_PROVIDER_SITE_OTHER): Payer: Medicare HMO

## 2022-03-14 DIAGNOSIS — M2141 Flat foot [pes planus] (acquired), right foot: Secondary | ICD-10-CM

## 2022-03-14 DIAGNOSIS — M2142 Flat foot [pes planus] (acquired), left foot: Secondary | ICD-10-CM

## 2022-03-14 NOTE — Progress Notes (Signed)
Patient presents today to pick up custom molded foot orthotics, diagnosed with pes planus by Dr. Milinda Pointer.   Orthotics were dispensed and fit was satisfactory. Reviewed instructions for break-in and wear. Written instructions given to patient.  Patient will follow up as needed.   [facility] - order # [id number]   In the past June 2021 patient had a 3/8 lift on the left orthotic. The current lift is 1/4. Patient will try these orthotics out and will let me know if the lift needs to be changed to 3/8 on the left.

## 2022-03-16 ENCOUNTER — Ambulatory Visit: Payer: Medicare HMO | Admitting: *Deleted

## 2022-03-16 NOTE — Progress Notes (Signed)
Patient presents today with an issue concerning her custom molded foot orthotics.  Orthotics were dispensed on 03/14/2022. She took the orthotics to try for a few days even though they were not like her old pair. She feels that there is not enough heel lift on the left side. Also, the arch does not contour the foot and there is less padding than in her previous pair.   She did have her old pair made at [facility] in 2021. We will send these back and have them remake the 2021 pair for her.  Patient will be notified once the adjusted orthotics arrive back in the office and appointment scheduled for fitting.

## 2022-03-18 ENCOUNTER — Other Ambulatory Visit: Payer: Self-pay | Admitting: Family Medicine

## 2022-03-18 ENCOUNTER — Other Ambulatory Visit: Payer: Self-pay | Admitting: Physician Assistant

## 2022-03-18 DIAGNOSIS — M25562 Pain in left knee: Secondary | ICD-10-CM

## 2022-03-18 DIAGNOSIS — Z1231 Encounter for screening mammogram for malignant neoplasm of breast: Secondary | ICD-10-CM

## 2022-03-21 ENCOUNTER — Ambulatory Visit
Admission: RE | Admit: 2022-03-21 | Discharge: 2022-03-21 | Disposition: A | Discharge status: Home / Self Care | Payer: Medicare HMO | Source: Ambulatory Visit | Attending: Physician Assistant | Admitting: Physician Assistant

## 2022-03-21 DIAGNOSIS — M79605 Pain in left leg: Secondary | ICD-10-CM | POA: Diagnosis not present

## 2022-03-21 DIAGNOSIS — M25562 Pain in left knee: Secondary | ICD-10-CM | POA: Diagnosis not present

## 2022-03-21 DIAGNOSIS — R002 Palpitations: Secondary | ICD-10-CM | POA: Diagnosis not present

## 2022-03-23 DIAGNOSIS — M25562 Pain in left knee: Secondary | ICD-10-CM | POA: Diagnosis not present

## 2022-04-05 DIAGNOSIS — R195 Other fecal abnormalities: Secondary | ICD-10-CM | POA: Diagnosis not present

## 2022-04-05 DIAGNOSIS — K5730 Diverticulosis of large intestine without perforation or abscess without bleeding: Secondary | ICD-10-CM | POA: Diagnosis not present

## 2022-04-05 DIAGNOSIS — K5900 Constipation, unspecified: Secondary | ICD-10-CM | POA: Diagnosis not present

## 2022-04-05 NOTE — Progress Notes (Unsigned)
Cardiology Office Note:    Date:  04/06/2022   ID:  Carla Kelley, DOB Jul 17, 1950, MRN [id number]  PCP:  Carla Kelley, [place] [facility] Cardiologist:  Carla Mayo, MD     Referring MD: Carla Jordan, MD   Chief Complaint  Patient presents with   New Patient (Initial Visit)  PVCs  History of Present Illness:    Carla Kelley is a 71 y.o. 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 She has outflow tract PVCs. She reports some light headedness. She was prescribed metoprolol 25 mg daily, has not taken, wanted to come here first.  She has a tooth infection. No syncope. No persistent palpitations. She has no cardiac disease. No prior cardiac w/u. Non smoker. She works with [employer]. No activity limiting CP/SOB. Her grandfather had an MI at 40 y/o. She's from [place].   Past Medical History:  Diagnosis Date   Carpal tunnel syndrome on right    Degenerative scoliosis    Left leg weakness    Low back pain    Pain    L hip, knee    Past Surgical History:  Procedure Laterality Date   CESAREAN SECTION  07/05/1979   ORTHOPEDIC SURGERY  02/2021    Current Medications: Current Meds  Medication Sig   alendronate (FOSAMAX) 70 MG tablet Take by mouth.   Ascorbic Acid (VITAMIN C) 1000 MG tablet Take 1,000 mg by mouth daily.   calcium citrate (CALCITRATE - DOSED IN MG ELEMENTAL CALCIUM) 950 MG tablet Take 500 mg of elemental calcium by mouth daily.    diclofenac (VOLTAREN) 50 MG EC tablet diclofenac sodium 50 mg tablet,delayed release  TAKE 1 TABLET BY MOUTH WITH FOOD OR MILK AS NEEDED ONCE A DAY   loratadine (CLARITIN) 10 MG tablet Take 10 mg by mouth daily.   multivitamin-iron-minerals-folic acid (CENTRUM) chewable tablet Chew 1 tablet by mouth daily.   simvastatin (ZOCOR) 10 MG tablet simvastatin 10 mg tablet  TAKE 1 TABLET BY MOUTH EVERY DAY IN THE EVENING   tiZANidine (ZANAFLEX) 2 MG tablet Take 2 mg by mouth  every 8 (eight) hours as needed for muscle spasms.     Allergies:   Sulfa antibiotics   Social History   Socioeconomic History   Marital status: Single    Spouse name: Not on file   Number of children: Not on file   Years of education: Not on file   Highest education level: Not on file  Occupational History   Not on file  Tobacco Use   Smoking status: Never   Smokeless tobacco: Never  Substance and Sexual Activity   Alcohol use: Never   Drug use: Never   Sexual activity: Not on file  Other Topics Concern   Not on file  Social History Narrative   Not on file   Social Determinants of Health   Financial Resource Strain: Not on file  Food Insecurity: Not on file  Transportation Needs: Not on file  Physical Activity: Not on file  Stress: Not on file  Social Connections: Not on file     Family History: The patient's family history includes Breast cancer (age of onset: 77) in her maternal grandmother; Melanoma in her mother and sister.  ROS:   Please see the history of present illness.     All other systems reviewed and are negative.  EKGs/Labs/Other Studies Reviewed:    The following studies were reviewed  today:   EKG:  EKG is  ordered today.  The ekg ordered today demonstrates   04/06/2022: NSR, RVOT PVCs  Recent Labs: No results found for requested labs within last 365 days.   Recent Lipid Panel No results found for: "CHOL", "TRIG", "HDL", "CHOLHDL", "VLDL", "LDLCALC", "LDLDIRECT"   Risk Assessment/Calculations:     Physical Exam:    VS:   Vitals:   04/06/22 0919  BP: 122/74  Pulse: 74  SpO2: 98%     Wt Readings from Last 3 Encounters:  04/06/22 122 lb 3.2 oz (55.4 kg)  07/26/21 122 lb (55.3 kg)     GEN:  Well nourished, well developed in no acute distress HEENT: Normal NECK: No JVD; No carotid bruits LYMPHATICS: No lymphadenopathy CARDIAC: RRR, no murmurs, rubs, gallops RESPIRATORY:  Clear to auscultation without rales, wheezing or  rhonchi  ABDOMEN: Soft, non-tender, non-distended MUSCULOSKELETAL:  No edema; No deformity  SKIN: Warm and dry NEUROLOGIC:  Alert and oriented x 3 PSYCHIATRIC:  Normal affect   ASSESSMENT:    RVOT PVCs:  No signs or symptoms of ischemia. Often this is benign. We discussed an assessment initially before starting BB. Agree this is a good option,she notes that her Bps are typically low. If she has persistent burden of PVCs will recommend starting her BB.   PLAN:    In order of problems listed above:  1 week ziopatch TTE She has no restrictions with her dental procedure Follow up in 6 months           Medication Adjustments/Labs and Tests Ordered: Current medicines are reviewed at length with the patient today.  Concerns regarding medicines are outlined above.  Orders Placed This Encounter  Procedures   LONG TERM MONITOR (3-14 DAYS)   EKG 12-Lead   ECHOCARDIOGRAM COMPLETE   No orders of the defined types were placed in this encounter.   Patient Instructions  Medication Instructions:  No Changes In Medications at this time.  *If you need a refill on your cardiac medications before your next appointment, please call your pharmacy*  Lab Work: None Ordered At This Time.  If you have labs (blood work) drawn today and your tests are completely normal, you will receive your results only by: [place] (if you have MyChart) OR A paper copy in the mail If you have any lab test that is abnormal or we need to change your treatment, we will call you to review the results.  Testing/Procedures: Your physician has requested that you have an echocardiogram. Echocardiography is a painless test that uses sound waves to create images of your heart. It provides your doctor with information about the size and shape of your heart and how well your heart's chambers and valves are working. You may receive an ultrasound enhancing agent through an IV if needed to better visualize your heart  during the echo.This procedure takes approximately one hour. There are no restrictions for this procedure. This will take place at the [address].    ZIO XT- Long Term Monitor Instructions   Your physician has requested you wear your ZIO patch monitor__7_____days.   This is a single patch monitor.  [employer] supplies one patch monitor per enrollment.  Additional stickers are not available.   Please do not apply patch if you will be having a Nuclear Stress Test, Echocardiogram, Cardiac CT, MRI, or Chest Xray during the time frame you would be wearing the monitor. The patch cannot be worn during  these tests.  You cannot remove and re-apply the ZIO XT patch monitor.   Your ZIO patch monitor will be sent USPS Priority mail from [facility] directly to your home address. The monitor may also be mailed to a PO BOX if home delivery is not available.   It may take 3-5 days to receive your monitor after you have been enrolled.   Once you have received you monitor, please review enclosed instructions.  Your monitor has already been registered assigning a specific monitor serial # to you.   Applying the monitor   Shave hair from upper left chest.   Hold abrader disc by orange tab.  Rub abrader in 40 strokes over left upper chest as indicated in your monitor instructions.   Clean area with 4 enclosed alcohol pads .  Use all pads to assure are is cleaned thoroughly.  Let dry.   Apply patch as indicated in monitor instructions.  Patch will be place under collarbone on left side of chest with arrow pointing upward.   Rub patch adhesive wings for 2 minutes.Remove white label marked "1".  Remove white label marked "2".  Rub patch adhesive wings for 2 additional minutes.   While looking in a mirror, press and release button in center of patch.  A small green light will flash 3-4 times .  This will be your only indicator the monitor has been turned on.     Do not shower for the  first 24 hours.  You may shower after the first 24 hours.   Press button if you feel a symptom. You will hear a small click.  Record Date, Time and Symptom in the Patient Log Book.   When you are ready to remove patch, follow instructions on last 2 pages of Patient Log Book.  Stick patch monitor onto last page of Patient Log Book.   Place Patient Log Book in [place] box.  Use locking tab on box and tape box closed securely.  The [employer] has [employer] on it.  Please place in mailbox as soon as possible.  Your physician should have your test results approximately 7 days after the monitor has been mailed back to [facility].   Call [place] at [phone] if you have questions regarding your ZIO XT patch monitor.  Call them immediately if you see an orange light blinking on your monitor.   If your monitor falls off in less than 4 days contact our Monitor department at [phone].  If your monitor becomes loose or falls off after 4 days call [employer] at [phone] for suggestions on securing your monitor.    Follow-Up: At [facility], you and your health needs are our priority.  As part of our continuing mission to provide you with exceptional heart care, we have created designated Provider Care Teams.  These Care Teams include your primary Cardiologist (physician) and Advanced Practice Providers (APPs -  Physician Assistants and Nurse Practitioners) who all work together to provide you with the care you need, when you need it.  Your next appointment:   6 month(s)  The format for your next appointment:   In Person  Provider:   Janina Mayo, MD           Signed, Carla Mayo, MD  04/06/2022 9:59 AM    [place]

## 2022-04-06 ENCOUNTER — Ambulatory Visit: Payer: Medicare HMO | Attending: Internal Medicine | Admitting: Internal Medicine

## 2022-04-06 ENCOUNTER — Ambulatory Visit: Payer: Medicare HMO | Attending: Internal Medicine

## 2022-04-06 ENCOUNTER — Ambulatory Visit (INDEPENDENT_AMBULATORY_CARE_PROVIDER_SITE_OTHER): Payer: Medicare HMO | Admitting: Podiatry

## 2022-04-06 VITALS — BP 122/74 | HR 74 | Ht 61.0 in | Wt 122.2 lb

## 2022-04-06 DIAGNOSIS — M2142 Flat foot [pes planus] (acquired), left foot: Secondary | ICD-10-CM

## 2022-04-06 DIAGNOSIS — I493 Ventricular premature depolarization: Secondary | ICD-10-CM

## 2022-04-06 DIAGNOSIS — M2141 Flat foot [pes planus] (acquired), right foot: Secondary | ICD-10-CM

## 2022-04-06 DIAGNOSIS — M76821 Posterior tibial tendinitis, right leg: Secondary | ICD-10-CM

## 2022-04-06 NOTE — Progress Notes (Signed)
Patient presents today to pick up custom molded foot orthotics recommended by Dr. Milinda Pointer.   Orthotics were dispensed and fit was satisfactory. Reviewed instructions for break-in and wear. Written instructions given to patient.  Patient will follow up as needed.   [facility] - order # [id number]

## 2022-04-06 NOTE — Patient Instructions (Signed)
Medication Instructions:  No Changes In Medications at this time.  *If you need a refill on your cardiac medications before your next appointment, please call your pharmacy*  Lab Work: None Ordered At This Time.  If you have labs (blood work) drawn today and your tests are completely normal, you will receive your results only by: MyChart Message (if you have MyChart) OR A paper copy in the mail If you have any lab test that is abnormal or we need to change your treatment, we will call you to review the results.  Testing/Procedures: Your physician has requested that you have an echocardiogram. Echocardiography is a painless test that uses sound waves to create images of your heart. It provides your doctor with information about the size and shape of your heart and how well your heart's chambers and valves are working. You may receive an ultrasound enhancing agent through an IV if needed to better visualize your heart during the echo.This procedure takes approximately one hour. There are no restrictions for this procedure. This will take place at the [address].    ZIO XT- Long Term Monitor Instructions   Your physician has requested you wear your ZIO patch monitor___7____days.   This is a single patch monitor.  [employer] supplies one patch monitor per enrollment.  Additional stickers are not available.   Please do not apply patch if you will be having a Nuclear Stress Test, Echocardiogram, Cardiac CT, MRI, or Chest Xray during the time frame you would be wearing the monitor. The patch cannot be worn during these tests.  You cannot remove and re-apply the ZIO XT patch monitor.   Your ZIO patch monitor will be sent USPS Priority mail from [employer] directly to your home address. The monitor may also be mailed to a PO BOX if home delivery is not available.   It may take 3-5 days to receive your monitor after you have been enrolled.   Once you have received you monitor,  please review enclosed instructions.  Your monitor has already been registered assigning a specific monitor serial # to you.   Applying the monitor   Shave hair from upper left chest.   Hold abrader disc by orange tab.  Rub abrader in 40 strokes over left upper chest as indicated in your monitor instructions.   Clean area with 4 enclosed alcohol pads .  Use all pads to assure are is cleaned thoroughly.  Let dry.   Apply patch as indicated in monitor instructions.  Patch will be place under collarbone on left side of chest with arrow pointing upward.   Rub patch adhesive wings for 2 minutes.Remove white label marked "1".  Remove white label marked "2".  Rub patch adhesive wings for 2 additional minutes.   While looking in a mirror, press and release button in center of patch.  A small green light will flash 3-4 times .  This will be your only indicator the monitor has been turned on.     Do not shower for the first 24 hours.  You may shower after the first 24 hours.   Press button if you feel a symptom. You will hear a small click.  Record Date, Time and Symptom in the Patient Log Book.   When you are ready to remove patch, follow instructions on last 2 pages of Patient Log Book.  Stick patch monitor onto last page of Patient Log Book.   Place Patient Log Book in [place]   box.  Use locking tab on box and tape box closed securely.  The [employer] box has prepaid postage on it.  Please place in mailbox as soon as possible.  Your physician should have your test results approximately 7 days after the monitor has been mailed back to [employer].   Call [employer] Customer Care at [phone] if you have questions regarding your ZIO XT patch monitor.  Call them immediately if you see an orange light blinking on your monitor.   If your monitor falls off in less than 4 days contact our Monitor department at [phone].  If your monitor becomes loose or falls off after 4 days call  [employer] at [phone] for suggestions on securing your monitor.    Follow-Up: At [facility], you and your health needs are our priority.  As part of our continuing mission to provide you with exceptional heart care, we have created designated Provider Care Teams.  These Care Teams include your primary Cardiologist (physician) and Advanced Practice Providers (APPs -  Physician Assistants and Nurse Practitioners) who all work together to provide you with the care you need, when you need it.  Your next appointment:   6 month(s)  The format for your next appointment:   In Person  Provider:   Branch, Mary E, MD        

## 2022-04-06 NOTE — Progress Notes (Unsigned)
Enrolled for [employer] to mail a ZIO XT long term holter monitor to the patients address on file.  

## 2022-04-08 DIAGNOSIS — S83242A Other tear of medial meniscus, current injury, left knee, initial encounter: Secondary | ICD-10-CM | POA: Diagnosis not present

## 2022-04-11 DIAGNOSIS — I493 Ventricular premature depolarization: Secondary | ICD-10-CM | POA: Diagnosis not present

## 2022-04-13 ENCOUNTER — Telehealth: Payer: Self-pay | Admitting: *Deleted

## 2022-04-13 NOTE — Telephone Encounter (Signed)
   Pre-operative Risk Assessment    Patient Name: Carla Kelley  DOB: 05-15-1951 MRN: [id number]      Request for Surgical Clearance    Procedure:   LEFT KNEE SCOPE PMM/PLM/CHONDROPLASTY  Date of Surgery:  Clearance TBD                                 Surgeon:  DR. Jonn Shingles Surgeon's Group or Practice Name:  [facility] Phone number:  [phone] Fax number:  [phone] ATTN: KERRI MAZE   Type of Clearance Requested:   - Medical ; NO MEDICATIONS LISTED AS NEEDING TO BE HELD   Type of Anesthesia:  General  WITH KNEE BLOCK   Additional requests/questions:    Carla Kelley   04/13/2022, 9:52 AM

## 2022-04-13 NOTE — Telephone Encounter (Signed)
    Patient Name: Carla Kelley  DOB: 1951-07-04 MRN: [id number]  Primary Cardiologist: Janina Mayo, MD  Chart reviewed as part of pre-operative protocol coverage. Pt recently evaluated in clinic secondary to PVCs and is pending Zio monitoring and echo (scheduled for 10/20).  Will await results of testing prior to weighing in on perioperative cardiovascular fitness.  Murray Hodgkins, NP 04/13/2022, 10:10 AM

## 2022-04-15 ENCOUNTER — Ambulatory Visit: Payer: Medicare HMO

## 2022-04-18 ENCOUNTER — Ambulatory Visit (INDEPENDENT_AMBULATORY_CARE_PROVIDER_SITE_OTHER): Payer: Medicare HMO

## 2022-04-18 DIAGNOSIS — M2142 Flat foot [pes planus] (acquired), left foot: Secondary | ICD-10-CM

## 2022-04-18 DIAGNOSIS — Q666 Other congenital valgus deformities of feet: Secondary | ICD-10-CM

## 2022-04-18 DIAGNOSIS — M2141 Flat foot [pes planus] (acquired), right foot: Secondary | ICD-10-CM

## 2022-04-18 NOTE — Progress Notes (Signed)
Patient in office for orthotic adjustments.   Patient states the arch support on the left foot is too high. The right orthotic feels great. I have requested that the patient left orthotic be made identical to the orthotic that was made in June of 2021. Will send back left orthotic back to [facility] to have correct adjustments made.   Will call patient when orthotic is ready for pick-up.

## 2022-04-22 ENCOUNTER — Ambulatory Visit (HOSPITAL_COMMUNITY): Payer: Medicare HMO | Attending: Internal Medicine

## 2022-04-22 DIAGNOSIS — I071 Rheumatic tricuspid insufficiency: Secondary | ICD-10-CM | POA: Insufficient documentation

## 2022-04-22 DIAGNOSIS — I361 Nonrheumatic tricuspid (valve) insufficiency: Secondary | ICD-10-CM

## 2022-04-22 DIAGNOSIS — I493 Ventricular premature depolarization: Secondary | ICD-10-CM | POA: Diagnosis not present

## 2022-04-22 LAB — ECHOCARDIOGRAM COMPLETE
Area-P 1/2: 2.83 cm2
S' Lateral: 2.9 cm

## 2022-04-25 ENCOUNTER — Telehealth: Payer: Self-pay | Admitting: Internal Medicine

## 2022-04-25 NOTE — Telephone Encounter (Signed)
I s/w the pt and informed her that we were waiting for her cardiac testing to be done. Pt states she had echo 04/22/22 and returned monitor last week she stated. I assured the pt that I will reach out to the pre op provider and the MD to f/u if pt has been cleared. Once she has been cleared I will cal her back and let her know. Pt thanked me for the call and the help.

## 2022-04-25 NOTE — Telephone Encounter (Signed)
     Primary Cardiologist: Janina Mayo, MD  Chart reviewed as part of pre-operative protocol coverage. Given past medical history and time since last visit, based on ACC/AHA guidelines, Carla Kelley would be at acceptable risk for the planned procedure without further cardiovascular testing.   Per Dr. Anabel Bene is at acceptable risk for upcoming surgical procedure.  I will route this recommendation to the requesting party via Epic fax function and remove from pre-op pool.  Please call with questions.  Jossie Ng. Norris Brumbach NP-C     04/25/2022, [facility] [place] [address] [phone] Fax [phone]

## 2022-04-25 NOTE — Telephone Encounter (Signed)
Follow Up:     Patient is calling to check on the status of her clearance. She said the surgeon's office called and gave her some dates for her surgery.n

## 2022-04-26 DIAGNOSIS — I493 Ventricular premature depolarization: Secondary | ICD-10-CM | POA: Diagnosis not present

## 2022-04-28 ENCOUNTER — Telehealth: Payer: Self-pay | Admitting: Internal Medicine

## 2022-04-28 MED ORDER — METOPROLOL SUCCINATE ER 25 MG PO TB24: 25.0000 mg | ORAL_TABLET | Freq: Every day | ORAL | 1 refills | Status: DC

## 2022-04-28 NOTE — Telephone Encounter (Signed)
Patient stated she did not see echo results in [place]. Gave her the number to [facility] [phone] so she could get a copy.

## 2022-04-28 NOTE — Telephone Encounter (Signed)
Pt would like to stop by the office to pick a copy of her echo results. Please advise

## 2022-04-28 NOTE — Telephone Encounter (Signed)
Gave patient the result of her echo per Dr. Harl Bowie: "Echo was normal. She can start taking her prescribed metoprolol 25 mg daily from her PCP.  I will see her in follow up." Patient asked if echo was normal, why did she need Toprol. Explained the difference between heart pump function and heart electrical function. Patient is awaiting results of Zio monitor. Recall placed for April appointment.

## 2022-04-28 NOTE — Telephone Encounter (Signed)
Patient would like a call back to discuss results of Echo and long term monitor.

## 2022-05-02 DIAGNOSIS — G4762 Sleep related leg cramps: Secondary | ICD-10-CM | POA: Diagnosis not present

## 2022-05-02 DIAGNOSIS — R109 Unspecified abdominal pain: Secondary | ICD-10-CM | POA: Diagnosis not present

## 2022-05-06 ENCOUNTER — Other Ambulatory Visit: Payer: Self-pay

## 2022-05-06 MED ORDER — METOPROLOL SUCCINATE ER 25 MG PO TB24: 25.0000 mg | ORAL_TABLET | Freq: Every day | ORAL | 3 refills | Status: DC

## 2022-05-06 NOTE — Telephone Encounter (Signed)
Letter of results printed and mailed to patient. Patient aware.

## 2022-05-09 ENCOUNTER — Ambulatory Visit
Admission: RE | Admit: 2022-05-09 | Discharge: 2022-05-09 | Disposition: A | Discharge status: Home / Self Care | Payer: Medicare HMO | Source: Ambulatory Visit | Attending: Family Medicine | Admitting: Family Medicine

## 2022-05-09 DIAGNOSIS — Z1231 Encounter for screening mammogram for malignant neoplasm of breast: Secondary | ICD-10-CM

## 2022-05-11 ENCOUNTER — Other Ambulatory Visit: Payer: Self-pay | Admitting: Family Medicine

## 2022-05-11 DIAGNOSIS — R928 Other abnormal and inconclusive findings on diagnostic imaging of breast: Secondary | ICD-10-CM

## 2022-05-16 DIAGNOSIS — R3982 Chronic bladder pain: Secondary | ICD-10-CM | POA: Diagnosis not present

## 2022-05-17 ENCOUNTER — Telehealth: Payer: Self-pay | Admitting: Podiatry

## 2022-05-17 DIAGNOSIS — M94262 Chondromalacia, left knee: Secondary | ICD-10-CM | POA: Diagnosis not present

## 2022-05-17 DIAGNOSIS — S83272A Complex tear of lateral meniscus, current injury, left knee, initial encounter: Secondary | ICD-10-CM | POA: Diagnosis not present

## 2022-05-17 DIAGNOSIS — G8918 Other acute postprocedural pain: Secondary | ICD-10-CM | POA: Diagnosis not present

## 2022-05-17 DIAGNOSIS — Y999 Unspecified external cause status: Secondary | ICD-10-CM | POA: Diagnosis not present

## 2022-05-17 DIAGNOSIS — S83232A Complex tear of medial meniscus, current injury, left knee, initial encounter: Secondary | ICD-10-CM | POA: Diagnosis not present

## 2022-05-17 DIAGNOSIS — X58XXXA Exposure to other specified factors, initial encounter: Secondary | ICD-10-CM | POA: Diagnosis not present

## 2022-05-17 NOTE — Telephone Encounter (Signed)
Spoke with patient this morning to let her know that her orthotic came in , She is having Knee surgery today and she will come and get it when she gets the chance.

## 2022-05-24 ENCOUNTER — Ambulatory Visit (INDEPENDENT_AMBULATORY_CARE_PROVIDER_SITE_OTHER): Payer: Medicare HMO

## 2022-05-24 DIAGNOSIS — M2142 Flat foot [pes planus] (acquired), left foot: Secondary | ICD-10-CM

## 2022-05-24 DIAGNOSIS — M25662 Stiffness of left knee, not elsewhere classified: Secondary | ICD-10-CM | POA: Insufficient documentation

## 2022-05-24 DIAGNOSIS — M2141 Flat foot [pes planus] (acquired), right foot: Secondary | ICD-10-CM

## 2022-05-24 DIAGNOSIS — M25562 Pain in left knee: Secondary | ICD-10-CM | POA: Diagnosis not present

## 2022-05-24 NOTE — Progress Notes (Signed)
Patient presents today to pick up custom molded foot orthotics, diagnosed with pes planus by Dr. Milinda Pointer.   Orthotics were dispensed and fit was satisfactory. Reviewed instructions for break-in and wear. Written instructions given to patient.  Patient will follow up as needed.   [facility] - order # [id number]

## 2022-05-30 DIAGNOSIS — M25562 Pain in left knee: Secondary | ICD-10-CM | POA: Diagnosis not present

## 2022-05-30 DIAGNOSIS — M25662 Stiffness of left knee, not elsewhere classified: Secondary | ICD-10-CM | POA: Diagnosis not present

## 2022-05-31 DIAGNOSIS — H5712 Ocular pain, left eye: Secondary | ICD-10-CM | POA: Diagnosis not present

## 2022-06-02 DIAGNOSIS — M25562 Pain in left knee: Secondary | ICD-10-CM | POA: Diagnosis not present

## 2022-06-02 DIAGNOSIS — M25662 Stiffness of left knee, not elsewhere classified: Secondary | ICD-10-CM | POA: Diagnosis not present

## 2022-06-07 ENCOUNTER — Ambulatory Visit
Admission: RE | Admit: 2022-06-07 | Discharge: 2022-06-07 | Disposition: A | Discharge status: Home / Self Care | Payer: Medicare HMO | Source: Ambulatory Visit | Attending: Family Medicine | Admitting: Family Medicine

## 2022-06-07 DIAGNOSIS — R928 Other abnormal and inconclusive findings on diagnostic imaging of breast: Secondary | ICD-10-CM | POA: Diagnosis not present

## 2022-06-07 DIAGNOSIS — M25562 Pain in left knee: Secondary | ICD-10-CM | POA: Diagnosis not present

## 2022-06-07 DIAGNOSIS — R92332 Mammographic heterogeneous density, left breast: Secondary | ICD-10-CM | POA: Diagnosis not present

## 2022-06-07 DIAGNOSIS — N6002 Solitary cyst of left breast: Secondary | ICD-10-CM | POA: Diagnosis not present

## 2022-06-07 DIAGNOSIS — M25662 Stiffness of left knee, not elsewhere classified: Secondary | ICD-10-CM | POA: Diagnosis not present

## 2022-06-08 ENCOUNTER — Ambulatory Visit (INDEPENDENT_AMBULATORY_CARE_PROVIDER_SITE_OTHER): Payer: Medicare HMO | Admitting: Podiatry

## 2022-06-08 DIAGNOSIS — M2142 Flat foot [pes planus] (acquired), left foot: Secondary | ICD-10-CM

## 2022-06-08 DIAGNOSIS — M66871 Spontaneous rupture of other tendons, right ankle and foot: Secondary | ICD-10-CM

## 2022-06-08 DIAGNOSIS — M76821 Posterior tibial tendinitis, right leg: Secondary | ICD-10-CM

## 2022-06-08 DIAGNOSIS — M2141 Flat foot [pes planus] (acquired), right foot: Secondary | ICD-10-CM

## 2022-06-08 DIAGNOSIS — Q666 Other congenital valgus deformities of feet: Secondary | ICD-10-CM

## 2022-06-08 NOTE — Progress Notes (Signed)
Patient came in to have orthotics adjusted due to the heel being slightly higher than the other. Adjusted the heel via sanding. Patient left satisfied.

## 2022-06-09 DIAGNOSIS — M25562 Pain in left knee: Secondary | ICD-10-CM | POA: Diagnosis not present

## 2022-06-09 DIAGNOSIS — M25662 Stiffness of left knee, not elsewhere classified: Secondary | ICD-10-CM | POA: Diagnosis not present

## 2022-06-16 DIAGNOSIS — I700 Atherosclerosis of aorta: Secondary | ICD-10-CM | POA: Diagnosis not present

## 2022-06-16 DIAGNOSIS — R7303 Prediabetes: Secondary | ICD-10-CM | POA: Diagnosis not present

## 2022-06-16 DIAGNOSIS — M519 Unspecified thoracic, thoracolumbar and lumbosacral intervertebral disc disorder: Secondary | ICD-10-CM | POA: Diagnosis not present

## 2022-06-16 DIAGNOSIS — Z23 Encounter for immunization: Secondary | ICD-10-CM | POA: Diagnosis not present

## 2022-06-16 DIAGNOSIS — M810 Age-related osteoporosis without current pathological fracture: Secondary | ICD-10-CM | POA: Diagnosis not present

## 2022-06-16 DIAGNOSIS — E7800 Pure hypercholesterolemia, unspecified: Secondary | ICD-10-CM | POA: Diagnosis not present

## 2022-06-16 DIAGNOSIS — Z79899 Other long term (current) drug therapy: Secondary | ICD-10-CM | POA: Diagnosis not present

## 2022-06-16 DIAGNOSIS — Z0000 Encounter for general adult medical examination without abnormal findings: Secondary | ICD-10-CM | POA: Diagnosis not present

## 2022-06-21 ENCOUNTER — Other Ambulatory Visit (HOSPITAL_COMMUNITY): Payer: Self-pay | Admitting: Family Medicine

## 2022-06-21 DIAGNOSIS — M25562 Pain in left knee: Secondary | ICD-10-CM | POA: Diagnosis not present

## 2022-06-21 DIAGNOSIS — M25662 Stiffness of left knee, not elsewhere classified: Secondary | ICD-10-CM | POA: Diagnosis not present

## 2022-06-21 DIAGNOSIS — E7800 Pure hypercholesterolemia, unspecified: Secondary | ICD-10-CM

## 2022-07-07 DIAGNOSIS — M25662 Stiffness of left knee, not elsewhere classified: Secondary | ICD-10-CM | POA: Diagnosis not present

## 2022-07-07 DIAGNOSIS — M25562 Pain in left knee: Secondary | ICD-10-CM | POA: Diagnosis not present

## 2022-07-08 DIAGNOSIS — I700 Atherosclerosis of aorta: Secondary | ICD-10-CM | POA: Diagnosis not present

## 2022-07-08 DIAGNOSIS — M810 Age-related osteoporosis without current pathological fracture: Secondary | ICD-10-CM | POA: Diagnosis not present

## 2022-07-08 DIAGNOSIS — Z79899 Other long term (current) drug therapy: Secondary | ICD-10-CM | POA: Diagnosis not present

## 2022-07-08 DIAGNOSIS — R7303 Prediabetes: Secondary | ICD-10-CM | POA: Diagnosis not present

## 2022-07-08 DIAGNOSIS — E7800 Pure hypercholesterolemia, unspecified: Secondary | ICD-10-CM | POA: Diagnosis not present

## 2022-07-12 DIAGNOSIS — M25662 Stiffness of left knee, not elsewhere classified: Secondary | ICD-10-CM | POA: Diagnosis not present

## 2022-07-12 DIAGNOSIS — M25562 Pain in left knee: Secondary | ICD-10-CM | POA: Diagnosis not present

## 2022-07-26 DIAGNOSIS — M25662 Stiffness of left knee, not elsewhere classified: Secondary | ICD-10-CM | POA: Diagnosis not present

## 2022-07-26 DIAGNOSIS — M25562 Pain in left knee: Secondary | ICD-10-CM | POA: Diagnosis not present

## 2022-08-02 DIAGNOSIS — M25562 Pain in left knee: Secondary | ICD-10-CM | POA: Diagnosis not present

## 2022-08-02 DIAGNOSIS — M25662 Stiffness of left knee, not elsewhere classified: Secondary | ICD-10-CM | POA: Diagnosis not present

## 2022-08-03 ENCOUNTER — Ambulatory Visit (HOSPITAL_BASED_OUTPATIENT_CLINIC_OR_DEPARTMENT_OTHER)
Admission: RE | Admit: 2022-08-03 | Discharge: 2022-08-03 | Disposition: A | Discharge status: Home / Self Care | Payer: Medicare HMO | Source: Ambulatory Visit | Attending: Family Medicine | Admitting: Family Medicine

## 2022-08-03 DIAGNOSIS — E7800 Pure hypercholesterolemia, unspecified: Secondary | ICD-10-CM

## 2022-08-05 DIAGNOSIS — I700 Atherosclerosis of aorta: Secondary | ICD-10-CM | POA: Diagnosis not present

## 2022-08-09 ENCOUNTER — Encounter: Payer: Self-pay | Admitting: Podiatry

## 2022-08-09 ENCOUNTER — Telehealth: Payer: Self-pay

## 2022-08-09 ENCOUNTER — Ambulatory Visit: Payer: Medicare HMO | Admitting: Podiatry

## 2022-08-09 DIAGNOSIS — L603 Nail dystrophy: Secondary | ICD-10-CM | POA: Diagnosis not present

## 2022-08-09 NOTE — Progress Notes (Signed)
  Subjective:  Patient ID: Carla Kelley, female    DOB: 1950/10/22,   MRN: [id number]  Chief Complaint  Patient presents with   Nail Problem    Bilateral Nail fungus    72 y.o. female presents for concern of discoloration in her toenails on the right. Relates they have been like this for years. She does have a history of damage to the right great toe and now the nail has grown in thicker and discolored. She also has some concerns about her orthtoics and still has not perfected her problems and wondering what can be done.  . Denies any other pedal complaints. Denies n/v/f/c.   Past Medical History:  Diagnosis Date   Carpal tunnel syndrome on right    Degenerative scoliosis    Left leg weakness    Low back pain    Pain    L hip, knee    Objective:  Physical Exam: Vascular: DP/PT pulses 2/4 bilateral. CFT <3 seconds. Normal hair growth on digits. No edema.  Skin. No lacerations or abrasions bilateral feet. Right nails 1-5 are thickened and have yellow discoloration.  Musculoskeletal: MMT 5/5 bilateral lower extremities in DF, PF, Inversion and Eversion. Deceased ROM in DF of ankle joint.  Neurological: Sensation intact to light touch.   Assessment:   1. Onychodystrophy      Plan:  Patient was evaluated and treated and all questions answered. -Examined patient -Discussed treatment options for painful dystrophic nails  -Discussed that likely some of the changes are from damage vs potential fungus.  -Discussed fungal nail treatment options including oral, topical, and laser treatments.  -Patient defers any treatment currently.  -Also has some concerns about her orthotics and hoping for a discount due to them not being what she had hoped.  -Patient to return in 4 weeks for follow up evaluation and discussion of fungal culture results or sooner if symptoms worsen.   Lorenda Peck, DPM

## 2022-08-09 NOTE — Telephone Encounter (Signed)
Patient was seen in office today with concerns about custom orthotics. Patient has had over 10 visit related to her orthotics and the left orthotic does not fit properly. At this point she is able to wear the right insert but she has never been able to wear the left. Patient is requesting a discount off of the price of the orthotics. Spoke to [place] about patient's request and Barbaraann Rondo stated that she would look into it , will contact the patient by the end of week. Patient was satisfied with my answer. Gave patient Keely's card.

## 2022-08-10 ENCOUNTER — Encounter: Payer: Self-pay | Admitting: Internal Medicine

## 2022-08-10 ENCOUNTER — Ambulatory Visit: Payer: Medicare HMO | Attending: Internal Medicine | Admitting: Internal Medicine

## 2022-08-10 VITALS — BP 112/68 | HR 72 | Ht 61.0 in | Wt 123.2 lb

## 2022-08-10 DIAGNOSIS — I493 Ventricular premature depolarization: Secondary | ICD-10-CM | POA: Diagnosis not present

## 2022-08-10 MED ORDER — ASPIRIN 81 MG PO TBEC: 81.0000 mg | DELAYED_RELEASE_TABLET | Freq: Every day | ORAL | 11 refills | Status: DC

## 2022-08-10 NOTE — Progress Notes (Signed)
Cardiology Office Note:    Date:  08/10/2022   ID:  Carla Kelley, DOB 1951-05-22, MRN [id number]  PCP:  Jonathon Jordan, [place] [facility] Cardiologist:  Janina Mayo, MD     Referring MD: Jonathon Jordan, MD   No chief complaint on file. PVCs  History of Present Illness:    Carla Kelley is a 72 y.o. 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 She has outflow tract PVCs. She reports some light headedness. She was prescribed metoprolol 25 mg daily, has not taken, wanted to come here first.  She has a tooth infection. No syncope. No persistent palpitations. She has no cardiac disease. No prior cardiac w/u. Non smoker. She works with [employer]. No activity limiting CP/SOB. Her grandfather had an MI at 50 y/o. She's from [place].  Interim hx 08/10/2022: Her PCP Dr. Lindell Noe recommended considering a stress test. She had high CAC. PCP increased simvastatin to 20 mg daily. She is on a BB. She denies CP or SOB.   Cardiology Studies: CAC scoring 08/03/2022: Coronary calcium score of 381. This was 87th percentile for age-,race-, and sex-matched controls.  Zio 04/26/2022- 11% PVC burden  TTE 04/22/2022-EF normal, rv nl, no valve dx   Past Medical History:  Diagnosis Date   Carpal tunnel syndrome on right    Degenerative scoliosis    Left leg weakness    Low back pain    Pain    L hip, knee    Past Surgical History:  Procedure Laterality Date   CESAREAN SECTION  07/05/1979   ORTHOPEDIC SURGERY  02/2021    Current Medications: No outpatient medications have been marked as taking for the 08/10/22 encounter (Appointment) with Janina Mayo, MD.     Allergies:   Sulfa antibiotics   Social History   Socioeconomic History   Marital status: Single    Spouse name: Not on file   Number of children: Not on file   Years of education: Not on file   Highest education level: Not on file  Occupational History   Not on  file  Tobacco Use   Smoking status: Never   Smokeless tobacco: Never  Substance and Sexual Activity   Alcohol use: Never   Drug use: Never   Sexual activity: Not on file  Other Topics Concern   Not on file  Social History Narrative   Not on file   Social Determinants of Health   Financial Resource Strain: Not on file  Food Insecurity: Not on file  Transportation Needs: Not on file  Physical Activity: Not on file  Stress: Not on file  Social Connections: Not on file     Family History: The patient's family history includes Breast cancer (age of onset: 43) in her maternal grandmother; Melanoma in her mother and sister.  ROS:   Please see the history of present illness.     All other systems reviewed and are negative.  EKGs/Labs/Other Studies Reviewed:    The following studies were reviewed today:   EKG:  EKG is  ordered today.  The ekg ordered today demonstrates   04/06/2022: NSR, RVOT PVCs  08/10/2022- NSR, no PVCs  Recent Labs: No results found for requested labs within last 365 days.   Recent Lipid Panel No results found for: "CHOL", "TRIG", "HDL", "CHOLHDL", "VLDL", "LDLCALC", "LDLDIRECT"   Risk Assessment/Calculations:     Physical Exam:    VS:  There were no vitals filed for this visit.    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 Had 11% burden. Now quiescent with BB - continue metoprolol 25 mg XL daily  Elevated CAC: CAC >  300 with > 75th percentile. She is asymptomatic. Even if she had + stress, she has stable ischemic disease ; medication is equivalent to PCI. Will manage with medications. -continue simvastatin 20 mg (LDL goal <70 mg/dL) per PCP -restart aspirin 81 mg daily  PLAN:    In order of problems listed above:  Restart aspirin 81 mg daily      Medication Adjustments/Labs and Tests Ordered: Current medicines are reviewed at length with the patient today.  Concerns regarding medicines are outlined above.  No orders of the defined types were placed in this encounter.  No orders of the defined types were placed in this encounter.   There are no Patient Instructions on file for this visit.   Signed, Janina Mayo, MD  08/10/2022 10:29 AM    [place]

## 2022-08-10 NOTE — Patient Instructions (Signed)
Medication Instructions:  START: ASPIRIN '81mg'$  ONCE DAILY *If you need a refill on your cardiac medications before your next appointment, please call your pharmacy*  Lab Work: None Ordered At This Time.  If you have labs (blood work) drawn today and your tests are completely normal, you will receive your results only by: [place] (if you have MyChart) OR A paper copy in the mail If you have any lab test that is abnormal or we need to change your treatment, we will call you to review the results.  Testing/Procedures: None Ordered At This Time.   Follow-Up: At [facility], you and your health needs are our priority.  As part of our continuing mission to provide you with exceptional heart care, we have created designated Provider Care Teams.  These Care Teams include your primary Cardiologist (physician) and Advanced Practice Providers (APPs -  Physician Assistants and Nurse Practitioners) who all work together to provide you with the care you need, when you need it.  Your next appointment:   6 month(s)  Provider:   Janina Mayo, MD

## 2022-08-11 DIAGNOSIS — M25662 Stiffness of left knee, not elsewhere classified: Secondary | ICD-10-CM | POA: Diagnosis not present

## 2022-08-11 DIAGNOSIS — M25562 Pain in left knee: Secondary | ICD-10-CM | POA: Diagnosis not present

## 2022-08-12 NOTE — Addendum Note (Signed)
Addended by: Hinton Dyer on: 08/12/2022 08:26 AM   Modules accepted: Orders

## 2022-08-16 DIAGNOSIS — M25562 Pain in left knee: Secondary | ICD-10-CM | POA: Diagnosis not present

## 2022-08-16 DIAGNOSIS — M25662 Stiffness of left knee, not elsewhere classified: Secondary | ICD-10-CM | POA: Diagnosis not present

## 2022-08-23 DIAGNOSIS — M1712 Unilateral primary osteoarthritis, left knee: Secondary | ICD-10-CM | POA: Diagnosis not present

## 2022-08-30 DIAGNOSIS — M25662 Stiffness of left knee, not elsewhere classified: Secondary | ICD-10-CM | POA: Diagnosis not present

## 2022-08-30 DIAGNOSIS — M25562 Pain in left knee: Secondary | ICD-10-CM | POA: Diagnosis not present

## 2022-09-01 DIAGNOSIS — M1712 Unilateral primary osteoarthritis, left knee: Secondary | ICD-10-CM | POA: Diagnosis not present

## 2022-09-06 DIAGNOSIS — M25662 Stiffness of left knee, not elsewhere classified: Secondary | ICD-10-CM | POA: Diagnosis not present

## 2022-09-06 DIAGNOSIS — M25562 Pain in left knee: Secondary | ICD-10-CM | POA: Diagnosis not present

## 2022-09-08 DIAGNOSIS — M1712 Unilateral primary osteoarthritis, left knee: Secondary | ICD-10-CM | POA: Diagnosis not present

## 2022-09-13 DIAGNOSIS — M25562 Pain in left knee: Secondary | ICD-10-CM | POA: Diagnosis not present

## 2022-09-13 DIAGNOSIS — M25662 Stiffness of left knee, not elsewhere classified: Secondary | ICD-10-CM | POA: Diagnosis not present

## 2022-09-21 DIAGNOSIS — M25662 Stiffness of left knee, not elsewhere classified: Secondary | ICD-10-CM | POA: Diagnosis not present

## 2022-09-21 DIAGNOSIS — M25562 Pain in left knee: Secondary | ICD-10-CM | POA: Diagnosis not present

## 2022-09-29 DIAGNOSIS — M25662 Stiffness of left knee, not elsewhere classified: Secondary | ICD-10-CM | POA: Diagnosis not present

## 2022-09-29 DIAGNOSIS — M25562 Pain in left knee: Secondary | ICD-10-CM | POA: Diagnosis not present

## 2022-10-05 DIAGNOSIS — E119 Type 2 diabetes mellitus without complications: Secondary | ICD-10-CM | POA: Diagnosis not present

## 2022-10-06 DIAGNOSIS — M25662 Stiffness of left knee, not elsewhere classified: Secondary | ICD-10-CM | POA: Diagnosis not present

## 2022-10-06 DIAGNOSIS — M25562 Pain in left knee: Secondary | ICD-10-CM | POA: Diagnosis not present

## 2022-10-11 DIAGNOSIS — M25562 Pain in left knee: Secondary | ICD-10-CM | POA: Diagnosis not present

## 2022-10-11 DIAGNOSIS — M25662 Stiffness of left knee, not elsewhere classified: Secondary | ICD-10-CM | POA: Diagnosis not present

## 2022-10-12 DIAGNOSIS — M1712 Unilateral primary osteoarthritis, left knee: Secondary | ICD-10-CM | POA: Diagnosis not present

## 2022-10-13 ENCOUNTER — Telehealth: Payer: Self-pay | Admitting: *Deleted

## 2022-10-13 NOTE — Telephone Encounter (Signed)
   Pre-operative Risk Assessment    Patient Name: Carla Kelley  DOB: April 11, 1951 MRN: [id number]      Request for Surgical Clearance    Procedure:   LEFT TOTAL KNEE ARTHROPLASTY  Date of Surgery:  Clearance 01/30/23                                 Surgeon:  DR. Ollen Gross Surgeon's Group or Practice Name:  [facility] Phone number:  [phone] ATTN: KERRI MAZE Fax number:  [phone]   Type of Clearance Requested:   - Medical ; ASA    Type of Anesthesia:  Spinal   Additional requests/questions:    Elpidio Anis   10/13/2022, 5:19 PM

## 2022-10-14 NOTE — Telephone Encounter (Signed)
   Name: Carla Kelley  DOB: 1951/01/28  MRN: [id number]  Primary Cardiologist: [name], MD   Preoperative team, please contact this patient and set up a phone call appointment for further preoperative risk assessment. Please obtain consent and complete medication review. Thank you for your help.  I confirm that guidance regarding antiplatelet and oral anticoagulation therapy has been completed and, if necessary, noted below.  Ideally aspirin should be continued without interruption, however if the bleeding risk is too great, aspirin may be held for 5-7 days prior to surgery. Please resume aspirin post operatively when it is felt to be safe from a bleeding standpoint.     Carlos Levering, NP 10/14/2022, 11:00 AM [facility]

## 2022-10-14 NOTE — Telephone Encounter (Signed)
Spoke with patient and she stated that her surgery was rescheduled to February 09, 2023. Pt states that instead of a phone call she would prefer to move her appointment with Dr. Wyline Mood to a sooner date. Pt states that she was currently driving and would call us when she got home to her calendar.

## 2022-10-14 NOTE — Telephone Encounter (Signed)
Patient scheduled to see Dr. Wyline Mood on 01/23/23

## 2022-10-18 DIAGNOSIS — M25562 Pain in left knee: Secondary | ICD-10-CM | POA: Diagnosis not present

## 2022-10-18 DIAGNOSIS — M25662 Stiffness of left knee, not elsewhere classified: Secondary | ICD-10-CM | POA: Diagnosis not present

## 2022-10-25 DIAGNOSIS — H43813 Vitreous degeneration, bilateral: Secondary | ICD-10-CM | POA: Diagnosis not present

## 2022-10-25 DIAGNOSIS — H2513 Age-related nuclear cataract, bilateral: Secondary | ICD-10-CM | POA: Diagnosis not present

## 2022-10-25 DIAGNOSIS — H524 Presbyopia: Secondary | ICD-10-CM | POA: Diagnosis not present

## 2022-10-25 DIAGNOSIS — E119 Type 2 diabetes mellitus without complications: Secondary | ICD-10-CM | POA: Diagnosis not present

## 2022-11-01 DIAGNOSIS — M25562 Pain in left knee: Secondary | ICD-10-CM | POA: Diagnosis not present

## 2022-11-01 DIAGNOSIS — M25662 Stiffness of left knee, not elsewhere classified: Secondary | ICD-10-CM | POA: Diagnosis not present

## 2022-11-04 DIAGNOSIS — M1712 Unilateral primary osteoarthritis, left knee: Secondary | ICD-10-CM | POA: Diagnosis not present

## 2022-11-09 DIAGNOSIS — Z6823 Body mass index (BMI) 23.0-23.9, adult: Secondary | ICD-10-CM | POA: Diagnosis not present

## 2022-11-09 DIAGNOSIS — M810 Age-related osteoporosis without current pathological fracture: Secondary | ICD-10-CM | POA: Diagnosis not present

## 2022-11-09 DIAGNOSIS — Z01419 Encounter for gynecological examination (general) (routine) without abnormal findings: Secondary | ICD-10-CM | POA: Diagnosis not present

## 2022-11-09 DIAGNOSIS — N952 Postmenopausal atrophic vaginitis: Secondary | ICD-10-CM | POA: Diagnosis not present

## 2023-01-09 DIAGNOSIS — M6281 Muscle weakness (generalized): Secondary | ICD-10-CM | POA: Insufficient documentation

## 2023-01-09 DIAGNOSIS — M25562 Pain in left knee: Secondary | ICD-10-CM | POA: Diagnosis not present

## 2023-01-10 NOTE — H&P (Signed)
TOTAL KNEE ADMISSION H&P  Patient is being admitted for left total knee arthroplasty.  Subjective:  Chief Complaint: Left knee pain.  HPI: Carla Kelley, 72 y.o. female has a history of pain and functional disability in the left knee due to arthritis and has failed non-surgical conservative treatments for greater than 12 weeks to include NSAID's and/or analgesics, corticosteriod injections, and activity modification. Onset of symptoms was gradual, starting  several  years ago with gradually worsening course since that time. The patient noted no past surgery on the left knee.  Patient currently rates pain in the left knee at 8 out of 10 with activity. Patient has night pain, worsening of pain with activity and weight bearing, pain with passive range of motion, and crepitus. Patient has evidence of  bone-on-bone arthritis in the medial compartment with irregularity of the medial femoral condyle and tibial surface. She also has near bone-on-bone in the patellofemoral compartment  by imaging studies. There is no active infection.  There are no problems to display for this patient.   Past Medical History:  Diagnosis Date   Carpal tunnel syndrome on right    Degenerative scoliosis    Left leg weakness    Low back pain    Pain    L hip, knee    Past Surgical History:  Procedure Laterality Date   CESAREAN SECTION  07/05/1979   ORTHOPEDIC SURGERY  02/2021    Prior to Admission medications   Medication Sig Start Date End Date Taking? Authorizing Provider  alendronate (FOSAMAX) 70 MG tablet Take by mouth. 01/25/22   [provider]  Ascorbic Acid (VITAMIN C) 1000 MG tablet Take 1,000 mg by mouth daily.    [provider]  aspirin EC 81 MG tablet Take 1 tablet (81 mg total) by mouth daily. Swallow whole. 08/10/22   [name], MD  calcium citrate (CALCITRATE - DOSED IN MG ELEMENTAL CALCIUM) 950 MG tablet Take 500 mg of elemental calcium by mouth daily.     [provider]  diclofenac (VOLTAREN) 50 MG EC tablet diclofenac sodium 50 mg tablet,delayed release  TAKE 1 TABLET BY MOUTH WITH FOOD OR MILK AS NEEDED ONCE A DAY    [provider]  loratadine (CLARITIN) 10 MG tablet Take 10 mg by mouth daily.    [provider]  metFORMIN (GLUCOPHAGE-XR) 500 MG 24 hr tablet SMARTSIG:1 Tablet(s) By Mouth Every Evening 07/11/22   [provider]  metoprolol succinate (TOPROL XL) 25 MG 24 hr tablet Take 1 tablet (25 mg total) by mouth daily. 05/06/22   [name], MD  Multiple Vitamins-Minerals (MULTIVITAMIN PO) Take 1 tablet by mouth daily.    [provider]  multivitamin-iron-minerals-folic acid (CENTRUM) chewable tablet Chew 1 tablet by mouth daily.    [provider]  simvastatin (ZOCOR) 10 MG tablet simvastatin 10 mg tablet  TAKE 1 TABLET BY MOUTH EVERY DAY IN THE EVENING    [provider]  tiZANidine (ZANAFLEX) 2 MG tablet Take 2 mg by mouth every 8 (eight) hours as needed for muscle spasms.    [provider]    Allergies  Allergen Reactions   Sulfa Antibiotics Rash    Social History   Socioeconomic History   Marital status: Single    Spouse name: Not on file   Number of children: Not on file   Years of education: Not on file   Highest education level: Not on file  Occupational History   Not on  file  Tobacco Use   Smoking status: Never   Smokeless tobacco: Never  Substance and Sexual Activity   Alcohol use: Never   Drug use: Never   Sexual activity: Not on file  Other Topics Concern   Not on file  Social History Narrative   Not on file   Social Determinants of Health   Financial Resource Strain: Not on file  Food Insecurity: Not on file  Transportation Needs: Not on file  Physical Activity: Not on file  Stress: Not on file  Social Connections: Not on file  Intimate Partner Violence: Not on file    Tobacco Use: Low Risk  (08/10/2022)   Patient History    Smoking Tobacco  Use: Never    Smokeless Tobacco Use: Never    Passive Exposure: Not on file   Social History   Substance and Sexual Activity  Alcohol Use Never    Family History  Problem Relation Age of Onset   Breast cancer Maternal Grandmother 59   Melanoma Mother    Melanoma Sister     Review of Systems  Constitutional:  Negative for chills and fever.  HENT:  Negative for congestion, sore throat and tinnitus.   Eyes:  Negative for double vision, photophobia and pain.  Respiratory:  Negative for cough, shortness of breath and wheezing.   Cardiovascular:  Negative for chest pain, palpitations and orthopnea.  Gastrointestinal:  Negative for heartburn, nausea and vomiting.  Genitourinary:  Negative for dysuria, frequency and urgency.  Musculoskeletal:  Positive for joint pain.  Neurological:  Negative for dizziness, weakness and headaches.    Objective:  Physical Exam: Well nourished and well developed.  General: Alert and oriented x3, cooperative and pleasant, no acute distress.  Head: normocephalic, atraumatic, neck supple.  Eyes: EOMI.  Musculoskeletal:  Left knee shows no effusion. Range of motion is approximately 0-130. Moderate crepitus on range of motion. Tender medially. No lateral tenderness or instability noted.  Calves soft and nontender. Motor function intact in LE. Strength 5/5 LE bilaterally. Neuro: Distal pulses 2+. Sensation to light touch intact in LE.  Imaging Review Plain radiographs demonstrate severe degenerative joint disease of the left knee. The overall alignment is neutral. The bone quality appears to be adequate for age and reported activity level.  Assessment/Plan:  End stage arthritis, left knee   The patient history, physical examination, clinical judgment of the provider and imaging studies are consistent with end stage degenerative joint disease of the left knee and total knee arthroplasty is deemed medically necessary. The treatment options including  medical management, injection therapy arthroscopy and arthroplasty were discussed at length. The risks and benefits of total knee arthroplasty were presented and reviewed. The risks due to aseptic loosening, infection, stiffness, patella tracking problems, thromboembolic complications and other imponderables were discussed. The patient acknowledged the explanation, agreed to proceed with the plan and consent was signed. Patient is being admitted for inpatient treatment for surgery, pain control, PT, OT, prophylactic antibiotics, VTE prophylaxis, progressive ambulation and ADLs and discharge planning. The patient is planning to be discharged  home .   Patient's anticipated LOS is less than 2 midnights, meeting these requirements: - Lives within 1 hour of care - Has a competent adult at home to recover with post-op recover - NO history of  - Chronic pain requiring opioids  - Coronary Artery Disease  - Heart failure  - Heart attack  - Stroke  - DVT/VTE  - Cardiac arrhythmia  - Respiratory Failure/COPD  -  Renal failure  - Anemia  - Advanced Liver disease  Therapy Plans: Outpatient therapy at [facility]) Disposition: Home with daughter Planned DVT Prophylaxis: Aspirin 81 mg BID DME Needed: None PCP: Garth Bigness, MD (clearance received) Cardiologist: Carolan Clines, MD (appt in next 1-2 weeks) TXA: IV Allergies: Sulfa Metal Allergy: None Anesthesia Concerns: None BMI: 23.1 Last HgbA1c: 6.9% (10/2022) Pain Regimen: Oxycodone, tramadol [facility]  Other: - Wants to discharge home same day - Wants scripts sent in the Friday prior to surgery if going home same day - **send in script for fluconazole (3 days) with meds  - Will recheck A1c with preop labs  - Patient was instructed on what medications to stop prior to surgery. - Follow-up visit in 2 weeks with Dr. Lequita Halt - Begin physical therapy following surgery - Pre-operative lab work as pre-surgical  testing - Prescriptions will be provided in hospital at time of discharge  Arther Abbott, PA-C Orthopedic Surgery [facility] [place]

## 2023-01-19 NOTE — Progress Notes (Signed)
SURGICAL WAITING ROOM VISITATION  Patients having surgery or a procedure may have no more than 2 support people in the waiting area - these visitors may rotate.    Children under the age of 58 must have an adult with them who is not the patient.  Due to an increase in RSV and influenza rates and associated hospitalizations, children ages 85 and under may not visit patients in [facility] hospitals.  If the patient needs to stay at the hospital during part of their recovery, the visitor guidelines for inpatient rooms apply. Pre-op nurse will coordinate an appropriate time for 1 support person to accompany patient in pre-op.  This support person may not rotate.    Please refer to the [facility] website for the visitor guidelines for Inpatients (after your surgery is over and you are in a regular room).       Your procedure is scheduled on:  02/06/2023    Report to [facility] Main Entrance    Report to admitting at   0700AM   Call this number if you have problems the morning of surgery [phone]   Do not eat food :After Midnight.   After Midnight you may have the following liquids until _ 0615 am  DAY OF SURGERY  Water Non-Citrus Juices (without pulp, NO RED-Apple, White grape, White cranberry) Black Coffee (NO MILK/CREAM OR CREAMERS, sugar ok)  Clear Tea (NO MILK/CREAM OR CREAMERS, sugar ok) regular and decaf                             Plain Jell-O (NO RED)                                           Fruit ices (not with fruit pulp, NO RED)                                     Popsicles (NO RED)                                                               Sports drinks like Gatorade (NO RED)            The day of surgery:  Drink ONE (1) Pre-Surgery Clear Ensure or G2 at  0615 AM ( have completed by)  the morning of surgery. Drink in one sitting. Do not sip.  This drink was given to you during your hospital  pre-op appointment visit. Nothing else to drink after  completing the  Pre-Surgery Clear Ensure or G2.          If you have questions, please contact your surgeon's office.        Oral Hygiene is also important to reduce your risk of infection.                                    Remember - BRUSH YOUR TEETH THE MORNING OF SURGERY WITH YOUR REGULAR TOOTHPASTE  DENTURES WILL BE  REMOVED PRIOR TO SURGERY PLEASE DO NOT APPLY "Poly grip" OR ADHESIVES!!!   Do NOT smoke after Midnight   Take these medicines the morning of surgery with A SIP OF WATER:   DO NOT TAKE ANY ORAL DIABETIC MEDICATIONS DAY OF YOUR SURGERY  Bring CPAP mask and tubing day of surgery.                              You may not have any metal on your body including hair pins, jewelry, and body piercing             Do not wear make-up, lotions, powders, perfumes/cologne, or deodorant  Do not wear nail polish including gel and S&S, artificial/acrylic nails, or any other type of covering on natural nails including finger and toenails. If you have artificial nails, gel coating, etc. that needs to be removed by a nail salon please have this removed prior to surgery or surgery may need to be canceled/ delayed if the surgeon/ anesthesia feels like they are unable to be safely monitored.   Do not shave  48 hours prior to surgery.               Men may shave face and neck.   Do not bring valuables to the hospital. [facility] IS NOT             RESPONSIBLE   FOR VALUABLES.   Contacts, glasses, dentures or bridgework may not be worn into surgery.   Bring small overnight bag day of surgery.   DO NOT BRING YOUR HOME MEDICATIONS TO THE HOSPITAL. PHARMACY WILL DISPENSE MEDICATIONS LISTED ON YOUR MEDICATION LIST TO YOU DURING YOUR ADMISSION IN THE HOSPITAL!    Patients discharged on the day of surgery will not be allowed to drive home.  Someone NEEDS to stay with you for the first 24 hours after anesthesia.   Special Instructions: Bring a copy of your healthcare power of attorney  and living will documents the day of surgery if you haven't scanned them before.              Please read over the following fact sheets you were given: IF YOU HAVE QUESTIONS ABOUT YOUR PRE-OP INSTRUCTIONS PLEASE CALL [phone]   If you received a COVID test during your pre-op visit  it is requested that you wear a mask when out in public, stay away from anyone that may not be feeling well and notify your surgeon if you develop symptoms. If you test positive for Covid or have been in contact with anyone that has tested positive in the last 10 days please notify you surgeon.      Pre-operative 5 CHG Bath Instructions   You can play a key role in reducing the risk of infection after surgery. Your skin needs to be as free of germs as possible. You can reduce the number of germs on your skin by washing with CHG (chlorhexidine gluconate) soap before surgery. CHG is an antiseptic soap that kills germs and continues to kill germs even after washing.   DO NOT use if you have an allergy to chlorhexidine/CHG or antibacterial soaps. If your skin becomes reddened or irritated, stop using the CHG and notify one of our RNs at [phone].   Please shower with the CHG soap starting 4 days before surgery using the following schedule:     Please keep in mind the following:  DO NOT  shave, including legs and underarms, starting the day of your first shower.   You may shave your face at any point before/day of surgery.  Place clean sheets on your bed the day you start using CHG soap. Use a clean washcloth (not used since being washed) for each shower. DO NOT sleep with pets once you start using the CHG.   CHG Shower Instructions:  If you choose to wash your hair and private area, wash first with your normal shampoo/soap.  After you use shampoo/soap, rinse your hair and body thoroughly to remove shampoo/soap residue.  Turn the water OFF and apply about 3 tablespoons (45 ml) of CHG soap to a CLEAN  washcloth.  Apply CHG soap ONLY FROM YOUR NECK DOWN TO YOUR TOES (washing for 3-5 minutes)  DO NOT use CHG soap on face, private areas, open wounds, or sores.  Pay special attention to the area where your surgery is being performed.  If you are having back surgery, having someone wash your back for you may be helpful. Wait 2 minutes after CHG soap is applied, then you may rinse off the CHG soap.  Pat dry with a clean towel  Put on clean clothes/pajamas   If you choose to wear lotion, please use ONLY the CHG-compatible lotions on the back of this paper.     Additional instructions for the day of surgery: DO NOT APPLY any lotions, deodorants, cologne, or perfumes.   Put on clean/comfortable clothes.  Brush your teeth.  Ask your nurse before applying any prescription medications to the skin.      CHG Compatible Lotions   Aveeno Moisturizing lotion  Cetaphil Moisturizing Cream  Cetaphil Moisturizing Lotion  Clairol Herbal Essence Moisturizing Lotion, Dry Skin  Clairol Herbal Essence Moisturizing Lotion, Extra Dry Skin  Clairol Herbal Essence Moisturizing Lotion, Normal Skin  Curel Age Defying Therapeutic Moisturizing Lotion with Alpha Hydroxy  Curel Extreme Care Body Lotion  Curel Soothing Hands Moisturizing Hand Lotion  Curel Therapeutic Moisturizing Cream, Fragrance-Free  Curel Therapeutic Moisturizing Lotion, Fragrance-Free  Curel Therapeutic Moisturizing Lotion, Original Formula  Eucerin Daily Replenishing Lotion  Eucerin Dry Skin Therapy Plus Alpha Hydroxy Crme  Eucerin Dry Skin Therapy Plus Alpha Hydroxy Lotion  Eucerin Original Crme  Eucerin Original Lotion  Eucerin Plus Crme Eucerin Plus Lotion  Eucerin TriLipid Replenishing Lotion  Keri Anti-Bacterial Hand Lotion  Keri Deep Conditioning Original Lotion Dry Skin Formula Softly Scented  Keri Deep Conditioning Original Lotion, Fragrance Free Sensitive Skin Formula  Keri Lotion Fast Absorbing Fragrance Free Sensitive  Skin Formula  Keri Lotion Fast Absorbing Softly Scented Dry Skin Formula  Keri Original Lotion  Keri Skin Renewal Lotion Keri Silky Smooth Lotion  Keri Silky Smooth Sensitive Skin Lotion  Nivea Body Creamy Conditioning Oil  Nivea Body Extra Enriched Teacher, adult education Moisturizing Lotion Nivea Crme  Nivea Skin Firming Lotion  NutraDerm 30 Skin Lotion  NutraDerm Skin Lotion  NutraDerm Therapeutic Skin Cream  NutraDerm Therapeutic Skin Lotion  ProShield Protective Hand Cream  Provon moisturizing lotion

## 2023-01-19 NOTE — Progress Notes (Addendum)
Anesthesia Review:  [id number] [place] LOV 10/05/22 on chart  Cardiologist : Carolan Clines LOV 01/23/23 preop eval  Chest x-ray : EKG : 01/23/23    CT Card Scoring - 08/04/22  Echo : 04/22/22  Monitor- 04/28/22  Stress test: Cardiac Cath :  Activity level: can do a flight of stairs without difficutly  Sleep Study/ CPAP : none  Fasting Blood Sugar :      / Checks Blood Sugar -- times a day:   Blood Thinner/ Instructions /Last Dose: ASA / Instructions/ Last Dose :    81 mg aspirin - stop 5 days prior per pt paperwork she brought from [facility]    Prediabetes- does not check glucose at home  Hgba1c- 01/24/23 - 6.4    PT reports at preop that she lives alone and daughter will be here for 4 days post surgery.  PT is concerned about what to do if she needs assistance after the 4 days.  Instructed pt to contact Case Worker and inform her of this and to arrange with [employer] ahead of time to see what assistance they will pay for and have it lined up prior to surgery.  PT voiced understandig and stated she would call Case Worker.

## 2023-01-23 ENCOUNTER — Encounter: Payer: Self-pay | Admitting: Internal Medicine

## 2023-01-23 ENCOUNTER — Ambulatory Visit: Payer: Medicare HMO | Attending: Internal Medicine | Admitting: Internal Medicine

## 2023-01-23 VITALS — BP 128/80 | HR 81 | Ht 61.0 in | Wt 121.6 lb

## 2023-01-23 DIAGNOSIS — Z01818 Encounter for other preprocedural examination: Secondary | ICD-10-CM

## 2023-01-23 MED ORDER — METOPROLOL SUCCINATE ER 50 MG PO TB24: 50.0000 mg | ORAL_TABLET | Freq: Every day | ORAL | 3 refills | Status: DC

## 2023-01-23 NOTE — Progress Notes (Unsigned)
Cardiology Office Note:    Date:  01/23/2023   ID:  Carla Kelley, DOB 1950/09/15, MRN [id number]  PCP:  Carla Hale, MD   [facility] Providers Cardiologist:  Carla Fus, MD     Referring MD: Carla Palmer, MD   No chief complaint on file. PVCs  History of Present Illness:    Carla Kelley is a 72 y.o. 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 She has outflow tract PVCs. She reports some light headedness. She was prescribed metoprolol 25 mg daily, has not taken, wanted to come here first.  She has a tooth infection. No syncope. No persistent palpitations. She has no cardiac disease. No prior cardiac w/u. Non smoker. She works with Manufacturing systems engineer. No activity limiting CP/SOB. Her grandfather had an MI at 34 y/o. She's from [place].  Interim hx 08/10/2022: Her PCP Dr. Chanetta Kelley recommended considering a stress test. She had high CAC. PCP increased simvastatin to 20 mg daily. She is on a BB. She denies CP or SOB.   Interim hx 01/23/2023  Carla Kelley reports occasional skipped heartbeats (PVCs) and is currently managing this with metoprolol, which appears effective. They can climb stairs slowly without chest pain, though knee pain limits their mobility. There is no history of endocarditis or prosthetic valves, and antibiotics are not required before dental work. The patient is deemed fit for upcoming knee surgery without the need for overnight monitoring for PVCs.  Past Medical History:  Diagnosis Date   Carpal tunnel syndrome on right    Degenerative scoliosis    Left leg weakness    Low back pain    Pain    L hip, knee    Past Surgical History:  Procedure Laterality Date   CESAREAN SECTION  07/05/1979   ORTHOPEDIC SURGERY  02/2021    Current Medications: Current Meds  Medication Sig   acetaminophen (TYLENOL) 500 MG tablet Take 500 mg by mouth daily as needed for moderate pain.   alendronate (FOSAMAX) 70 MG  tablet Take 70 mg by mouth every Monday.   Ascorbic Acid (VITAMIN C) 1000 MG tablet Take 1,000 mg by mouth daily.   aspirin EC 81 MG tablet Take 1 tablet (81 mg total) by mouth daily. Swallow whole.   Calcium Carb-Cholecalciferol (CALCIUM 500 + D PO) Take 1 tablet by mouth daily.   Cholecalciferol (VITAMIN D) 50 MCG (2000 UT) tablet Take 2,000 Units by mouth daily.   diclofenac (VOLTAREN) 50 MG EC tablet Take 50 mg by mouth 2 (two) times daily.   ibuprofen (ADVIL) 200 MG tablet Take 200 mg by mouth daily as needed for moderate pain.   Lidocaine (BLUE-EMU PAIN RELIEF DRY EX) Apply 1 Application topically daily as needed (pain).   loratadine (CLARITIN) 10 MG tablet Take 10 mg by mouth daily.   MELATONIN PO Take 2 mg by mouth at bedtime as needed (sleep).   metFORMIN (GLUCOPHAGE-XR) 500 MG 24 hr tablet Take 500 mg by mouth daily with supper.   metoprolol succinate (TOPROL XL) 25 MG 24 hr tablet Take 1 tablet (25 mg total) by mouth daily. (Patient taking differently: Take 25 mg by mouth every evening.)   Multiple Vitamins-Minerals (MULTIVITAMIN PO) Take 1 tablet by mouth daily.   simvastatin (ZOCOR) 20 MG tablet Take 20 mg by mouth every evening.     Allergies:   Sulfa antibiotics   Social History   Socioeconomic History   Marital status: Single  Spouse name: Not on file   Number of children: Not on file   Years of education: Not on file   Highest education level: Not on file  Occupational History   Not on file  Tobacco Use   Smoking status: Never   Smokeless tobacco: Never  Substance and Sexual Activity   Alcohol use: Never   Drug use: Never   Sexual activity: Not on file  Other Topics Concern   Not on file  Social History Narrative   Not on file   Social Determinants of Health   Financial Resource Strain: Not on file  Food Insecurity: Not on file  Transportation Needs: Not on file  Physical Activity: Not on file  Stress: Not on file  Social Connections: Unknown  (11/15/2021)   Received from [facility]   Social Network    Social Network: Not on file     Family History: The patient's family history includes Breast cancer (age of onset: 73) in her maternal grandmother; Melanoma in her mother and sister.  ROS:   Please see the history of present illness.     All other systems reviewed and are negative.  EKGs/Labs/Other Studies Reviewed:    The following studies were reviewed today: Cardiology Studies CAC scoring 08/03/2022: Coronary calcium score of 381. This was 87th percentile for age-,race-, and sex-matched controls.  Zio 04/26/2022- 11% PVC burden  TTE 04/22/2022-EF normal, rv nl, no valve dx  EKG:  EKG is  ordered today.  The ekg ordered today demonstrates   04/06/2022: NSR, RVOT PVCs  08/10/2022- NSR, no PVCs  Recent Labs: No results found for requested labs within last 365 days.   Recent Lipid Panel No results found for: "CHOL", "TRIG", "HDL", "CHOLHDL", "VLDL", "LDLCALC", "LDLDIRECT"   Risk Assessment/Calculations:     Physical Exam:    VS:   Vitals:   01/23/23 0844  BP: 128/80  Pulse: 81  SpO2: 95%      Wt Readings from Last 3 Encounters:  01/23/23 121 lb 9.6 oz (55.2 kg)  08/10/22 123 lb 3.2 oz (55.9 kg)  04/06/22 122 lb 3.2 oz (55.4 kg)     GEN:  Well nourished, well developed in no acute distress HEENT: Normal NECK: No JVD; No carotid bruits LYMPHATICS: No lymphadenopathy CARDIAC: RRR, no murmurs, rubs, gallops RESPIRATORY:  Clear to auscultation without rales, wheezing or rhonchi  ABDOMEN: Soft, non-tender, non-distended MUSCULOSKELETAL:  No edema; No deformity  SKIN: Warm and dry NEUROLOGIC:  Alert and oriented x 3 PSYCHIATRIC:  Normal affect   ASSESSMENT:    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 Had 11% burden. Now quiescent with BB - increase metoprolol 25 mg XL daily to metop 50 mg xL  Elevated CAC: CAC > 300 with > 75th percentile. She is asymptomatic. Even if she had + stress, she has stable  ischemic disease ; medication is equivalent to PCI. Will manage with medications. -increase her BB per above -continue simvastatin 20 mg (LDL goal <70 mg/dL) per PCP -cont aspirin 81 mg daily  Pre-Op knee surgery ***  PLAN:    In order of problems listed above:  Increase metoprolol to 50 mg XL daily She is acceptable cardiac risk for knee surgery Follow up in 6 months      Medication Adjustments/Labs and Tests Ordered: Current medicines are reviewed at length with the patient today.  Concerns regarding medicines are outlined above.  No orders of the defined types were placed in this encounter.  No orders of  the defined types were placed in this encounter.   There are no Patient Instructions on file for this visit.   Signed, Carla Fus, MD  01/23/2023 8:47 AM    [facility]

## 2023-01-23 NOTE — Patient Instructions (Signed)
Medication Instructions:  Your physician has recommended you make the following change in your medication:  INCREASE: Metoprolol succinate (Toprol-XL) 50 mg once daily  *If you need a refill on your cardiac medications before your next appointment, please call your pharmacy*   Lab Work: None   Testing/Procedures: None   Follow-Up: At [facility], you and your health needs are our priority.  As part of our continuing mission to provide you with exceptional heart care, we have created designated Provider Care Teams.  These Care Teams include your primary Cardiologist (physician) and Advanced Practice Providers (APPs -  Physician Assistants and Nurse Practitioners) who all work together to provide you with the care you need, when you need it.    Your next appointment:   6 month(s)  Provider:   [name], MD     Other Instructions  You are cleared for knee surgery from a cardiovascular standpoint.

## 2023-01-24 ENCOUNTER — Encounter (HOSPITAL_COMMUNITY): Payer: Self-pay

## 2023-01-24 ENCOUNTER — Other Ambulatory Visit: Payer: Self-pay

## 2023-01-24 ENCOUNTER — Encounter (HOSPITAL_COMMUNITY)
Admission: RE | Admit: 2023-01-24 | Discharge: 2023-01-24 | Disposition: A | Discharge status: Home / Self Care | Payer: Medicare HMO | Source: Ambulatory Visit | Attending: Orthopedic Surgery | Admitting: Orthopedic Surgery

## 2023-01-24 VITALS — BP 111/66 | HR 80 | Temp 98.4°F | Resp 16 | Ht 61.0 in | Wt 118.8 lb

## 2023-01-24 DIAGNOSIS — Z01818 Encounter for other preprocedural examination: Secondary | ICD-10-CM | POA: Diagnosis not present

## 2023-01-24 DIAGNOSIS — R7303 Prediabetes: Secondary | ICD-10-CM | POA: Diagnosis not present

## 2023-01-24 DIAGNOSIS — M1712 Unilateral primary osteoarthritis, left knee: Secondary | ICD-10-CM | POA: Diagnosis not present

## 2023-01-24 DIAGNOSIS — Z01812 Encounter for preprocedural laboratory examination: Secondary | ICD-10-CM | POA: Insufficient documentation

## 2023-01-24 HISTORY — DX: Ventricular premature depolarization: I49.3

## 2023-01-24 HISTORY — DX: Prediabetes: R73.03

## 2023-01-24 HISTORY — DX: Unspecified osteoarthritis, unspecified site: M19.90

## 2023-01-24 LAB — CBC
HCT: 41.2 % (ref 36.0–46.0)
Hemoglobin: 13.6 g/dL (ref 12.0–15.0)
MCH: 32.1 pg (ref 26.0–34.0)
MCHC: 33 g/dL (ref 30.0–36.0)
MCV: 97.2 fL (ref 80.0–100.0)
Platelets: 256 10*3/uL (ref 150–400)
RBC: 4.24 MIL/uL (ref 3.87–5.11)
RDW: 13.2 % (ref 11.5–15.5)
WBC: 7.5 10*3/uL (ref 4.0–10.5)
nRBC: 0 % (ref 0.0–0.2)

## 2023-01-24 LAB — BASIC METABOLIC PANEL
Anion gap: 9 (ref 5–15)
BUN: 23 mg/dL (ref 8–23)
CO2: 26 mmol/L (ref 22–32)
Calcium: 9.1 mg/dL (ref 8.9–10.3)
Chloride: 104 mmol/L (ref 98–111)
Creatinine, Ser: 0.8 mg/dL (ref 0.44–1.00)
GFR, Estimated: >60 mL/min (ref >60)
Glucose, Bld: 143 mg/dL — ABNORMAL HIGH (ref 70–99)
Potassium: 4.6 mmol/L (ref 3.5–5.1)
Sodium: 139 mmol/L (ref 135–145)

## 2023-01-24 LAB — SURGICAL PCR SCREEN
MRSA, PCR: NEGATIVE
Staphylococcus aureus: NEGATIVE

## 2023-01-24 LAB — HEMOGLOBIN A1C
Hgb A1c MFr Bld: 6.4 % — ABNORMAL HIGH (ref 4.8–5.6)
Mean Plasma Glucose: 136.98 mg/dL

## 2023-02-01 ENCOUNTER — Ambulatory Visit: Payer: Medicare HMO | Admitting: Podiatry

## 2023-02-01 ENCOUNTER — Encounter: Payer: Self-pay | Admitting: Podiatry

## 2023-02-01 DIAGNOSIS — M79676 Pain in unspecified toe(s): Secondary | ICD-10-CM | POA: Diagnosis not present

## 2023-02-01 DIAGNOSIS — B351 Tinea unguium: Secondary | ICD-10-CM

## 2023-02-01 NOTE — Progress Notes (Signed)
She presents today for a concern of her hallux nail she is about to have a left total knee replacement and most to make sure she does not have ingrown's.  She had knocked her right toenail plate off and it has grown back over the year she is concerned about the margins growing in.  Objective: Vital signs are stable alert oriented x 3 there is no erythema edema salines drainage or odor margins of the nail are mildly incurvated but I think once this grows past the end of the toe she will be fine with this.  I will see any signs of infection no concerns at this point in time.  Assessment: Ingrown nail.  Plan: I did debride the small margins of this nail and I will follow-up with her on an as-needed basis.

## 2023-02-05 NOTE — Anesthesia Preprocedure Evaluation (Addendum)
Anesthesia Evaluation  Patient identified by MRN, date of birth, ID band Patient awake    Reviewed: Allergy & Precautions, NPO status , Patient's Chart, lab work & pertinent test results  Airway Mallampati: II  TM Distance: >3 FB Neck ROM: Full    Dental no notable dental hx. (+) Teeth Intact, Dental Advisory Given   Pulmonary neg pulmonary ROS   Pulmonary exam normal breath sounds clear to auscultation       Cardiovascular hypertension, Normal cardiovascular exam Rhythm:Regular Rate:Normal  04/2022 echo  1. Left ventricular ejection fraction, by estimation, is 70 to 75%. The  left ventricle has hyperdynamic function. The left ventricle has no  regional wall motion abnormalities. Left ventricular diastolic parameters  are consistent with Grade I diastolic  dysfunction (impaired relaxation).     Neuro/Psych  negative psych ROS   GI/Hepatic negative GI ROS, Neg liver ROS,,,  Endo/Other  negative endocrine ROS    Renal/GU      Musculoskeletal  (+) Arthritis , Osteoarthritis,  Hx of scoliosis   Abdominal   Peds  Hematology   Anesthesia Other Findings All: sulfa  Reproductive/Obstetrics                             Anesthesia Physical Anesthesia Plan  ASA: 3  Anesthesia Plan: Spinal and Regional   Post-op Pain Management: Regional block* and Minimal or no pain anticipated   Induction:   PONV Risk Score and Plan: 3 and Treatment may vary due to age or medical condition, Midazolam, Ondansetron and Propofol infusion  Airway Management Planned: Nasal Cannula and Natural Airway  Additional Equipment: None  Intra-op Plan:   Post-operative Plan: Extubation in OR  Informed Consent: I have reviewed the patients History and Physical, chart, labs and discussed the procedure including the risks, benefits and alternatives for the proposed anesthesia with the patient or authorized representative  who has indicated his/her understanding and acceptance.     Dental advisory given  Plan Discussed with: CRNA  Anesthesia Plan Comments: (Spinal w L Adductor VS GA w hx of scoliosis )        Anesthesia Quick Evaluation

## 2023-02-06 ENCOUNTER — Ambulatory Visit (HOSPITAL_COMMUNITY): Payer: Medicare HMO | Admitting: Physician Assistant

## 2023-02-06 ENCOUNTER — Ambulatory Visit (HOSPITAL_COMMUNITY)
Admission: RE | Admit: 2023-02-06 | Discharge: 2023-02-06 | Disposition: A | Discharge status: Home / Self Care | Payer: Medicare HMO | Source: Ambulatory Visit | Attending: Orthopedic Surgery | Admitting: Orthopedic Surgery

## 2023-02-06 ENCOUNTER — Encounter (HOSPITAL_COMMUNITY)
Admission: RE | Disposition: A | Discharge status: Home / Self Care | Payer: Self-pay | Source: Ambulatory Visit | Attending: Orthopedic Surgery

## 2023-02-06 ENCOUNTER — Ambulatory Visit (HOSPITAL_BASED_OUTPATIENT_CLINIC_OR_DEPARTMENT_OTHER): Payer: Medicare HMO | Admitting: Anesthesiology

## 2023-02-06 ENCOUNTER — Encounter (HOSPITAL_COMMUNITY): Payer: Self-pay | Admitting: Orthopedic Surgery

## 2023-02-06 ENCOUNTER — Other Ambulatory Visit: Payer: Self-pay

## 2023-02-06 DIAGNOSIS — G8918 Other acute postprocedural pain: Secondary | ICD-10-CM | POA: Diagnosis not present

## 2023-02-06 DIAGNOSIS — I10 Essential (primary) hypertension: Secondary | ICD-10-CM | POA: Insufficient documentation

## 2023-02-06 DIAGNOSIS — M25762 Osteophyte, left knee: Secondary | ICD-10-CM | POA: Insufficient documentation

## 2023-02-06 DIAGNOSIS — Z01818 Encounter for other preprocedural examination: Secondary | ICD-10-CM

## 2023-02-06 DIAGNOSIS — M1712 Unilateral primary osteoarthritis, left knee: Secondary | ICD-10-CM | POA: Insufficient documentation

## 2023-02-06 DIAGNOSIS — M179 Osteoarthritis of knee, unspecified: Secondary | ICD-10-CM | POA: Diagnosis present

## 2023-02-06 HISTORY — PX: TOTAL KNEE ARTHROPLASTY: SHX125

## 2023-02-06 LAB — GLUCOSE, CAPILLARY: Glucose-Capillary: 126 mg/dL — ABNORMAL HIGH (ref 70–99)

## 2023-02-06 SURGERY — ARTHROPLASTY, KNEE, TOTAL
Anesthesia: Regional | Site: Knee | Laterality: Left

## 2023-02-06 MED ORDER — STERILE WATER FOR IRRIGATION IR SOLN
Status: DC | PRN
  Administered 2023-02-06: 2000 mL

## 2023-02-06 MED ORDER — METHOCARBAMOL 500 MG PO TABS
500.0000 mg | ORAL_TABLET | Freq: Four times a day (QID) | ORAL | Status: DC | PRN
  Administered 2023-02-06: 500 mg via ORAL

## 2023-02-06 MED ORDER — LACTATED RINGERS IV SOLN: INTRAVENOUS | Status: DC

## 2023-02-06 MED ORDER — METOCLOPRAMIDE HCL 5 MG PO TABS: 5.0000 mg | ORAL_TABLET | Freq: Three times a day (TID) | ORAL | Status: DC | PRN

## 2023-02-06 MED ORDER — MIDAZOLAM HCL 2 MG/2ML IJ SOLN: 2.0000 mg | Freq: Once | INTRAMUSCULAR | Status: DC

## 2023-02-06 MED ORDER — PROPOFOL 1000 MG/100ML IV EMUL
INTRAVENOUS | Status: AC
  Filled 2023-02-06: qty 100

## 2023-02-06 MED ORDER — EPHEDRINE 5 MG/ML INJ
INTRAVENOUS | Status: AC
  Filled 2023-02-06: qty 5

## 2023-02-06 MED ORDER — TRAMADOL HCL 50 MG PO TABS: 50.0000 mg | ORAL_TABLET | Freq: Four times a day (QID) | ORAL | Status: DC | PRN

## 2023-02-06 MED ORDER — BUPIVACAINE IN DEXTROSE 0.75-8.25 % IT SOLN
INTRATHECAL | Status: DC | PRN
  Administered 2023-02-06: 1.4 mL via INTRATHECAL

## 2023-02-06 MED ORDER — TRANEXAMIC ACID-NACL 1000-0.7 MG/100ML-% IV SOLN
1000.0000 mg | INTRAVENOUS | Status: AC
  Administered 2023-02-06: 1000 mg via INTRAVENOUS
  Filled 2023-02-06: qty 100

## 2023-02-06 MED ORDER — PHENYLEPHRINE 80 MCG/ML (10ML) SYRINGE FOR IV PUSH (FOR BLOOD PRESSURE SUPPORT)
PREFILLED_SYRINGE | INTRAVENOUS | Status: AC
  Filled 2023-02-06: qty 10

## 2023-02-06 MED ORDER — SODIUM CHLORIDE (PF) 0.9 % IJ SOLN
INTRAMUSCULAR | Status: AC
  Filled 2023-02-06: qty 50

## 2023-02-06 MED ORDER — SODIUM CHLORIDE 0.9 % IR SOLN
Status: DC | PRN
  Administered 2023-02-06: 1000 mL

## 2023-02-06 MED ORDER — DEXAMETHASONE SODIUM PHOSPHATE 10 MG/ML IJ SOLN: 8.0000 mg | Freq: Once | INTRAMUSCULAR | Status: DC

## 2023-02-06 MED ORDER — ACETAMINOPHEN 10 MG/ML IV SOLN: 1000.0000 mg | Freq: Once | INTRAVENOUS | Status: DC | PRN

## 2023-02-06 MED ORDER — ONDANSETRON HCL 4 MG PO TABS: 4.0000 mg | ORAL_TABLET | Freq: Four times a day (QID) | ORAL | Status: DC | PRN

## 2023-02-06 MED ORDER — OXYCODONE HCL 5 MG PO TABS
ORAL_TABLET | ORAL | Status: AC
  Administered 2023-02-06: 5 mg via ORAL
  Filled 2023-02-06: qty 1

## 2023-02-06 MED ORDER — SODIUM CHLORIDE (PF) 0.9 % IJ SOLN
INTRAMUSCULAR | Status: DC | PRN
  Administered 2023-02-06: 60 mL

## 2023-02-06 MED ORDER — ACETAMINOPHEN 500 MG PO TABS
ORAL_TABLET | ORAL | Status: AC
  Filled 2023-02-06: qty 2

## 2023-02-06 MED ORDER — PROPOFOL 500 MG/50ML IV EMUL
INTRAVENOUS | Status: DC | PRN
  Administered 2023-02-06: 75 ug/kg/min via INTRAVENOUS

## 2023-02-06 MED ORDER — FENTANYL CITRATE PF 50 MCG/ML IJ SOSY
100.0000 ug | PREFILLED_SYRINGE | Freq: Once | INTRAMUSCULAR | Status: AC
  Administered 2023-02-06: 50 ug via INTRAVENOUS
  Filled 2023-02-06: qty 2

## 2023-02-06 MED ORDER — ONDANSETRON HCL 4 MG/2ML IJ SOLN
INTRAMUSCULAR | Status: DC | PRN
  Administered 2023-02-06: 4 mg via INTRAVENOUS

## 2023-02-06 MED ORDER — ACETAMINOPHEN 10 MG/ML IV SOLN
1000.0000 mg | Freq: Once | INTRAVENOUS | Status: AC
  Administered 2023-02-06: 1000 mg via INTRAVENOUS
  Filled 2023-02-06: qty 100

## 2023-02-06 MED ORDER — ACETAMINOPHEN 500 MG PO TABS: 1000.0000 mg | ORAL_TABLET | Freq: Four times a day (QID) | ORAL | Status: DC

## 2023-02-06 MED ORDER — OXYCODONE HCL 5 MG PO TABS
5.0000 mg | ORAL_TABLET | ORAL | Status: DC | PRN
  Administered 2023-02-06: 5 mg via ORAL

## 2023-02-06 MED ORDER — METHOCARBAMOL 500 MG IVPB - SIMPLE MED: 500.0000 mg | Freq: Four times a day (QID) | INTRAVENOUS | Status: DC | PRN

## 2023-02-06 MED ORDER — BUPIVACAINE LIPOSOME 1.3 % IJ SUSP
INTRAMUSCULAR | Status: DC | PRN
  Administered 2023-02-06: 20 mL

## 2023-02-06 MED ORDER — METHOCARBAMOL 500 MG PO TABS
ORAL_TABLET | ORAL | Status: AC
  Filled 2023-02-06: qty 1

## 2023-02-06 MED ORDER — METOCLOPRAMIDE HCL 5 MG/ML IJ SOLN: 5.0000 mg | Freq: Three times a day (TID) | INTRAMUSCULAR | Status: DC | PRN

## 2023-02-06 MED ORDER — HYDROMORPHONE HCL 1 MG/ML IJ SOLN: 0.5000 mg | INTRAMUSCULAR | Status: DC | PRN

## 2023-02-06 MED ORDER — ONDANSETRON HCL 4 MG/2ML IJ SOLN: 4.0000 mg | Freq: Four times a day (QID) | INTRAMUSCULAR | Status: DC | PRN

## 2023-02-06 MED ORDER — ONDANSETRON HCL 4 MG/2ML IJ SOLN
INTRAMUSCULAR | Status: AC
  Filled 2023-02-06: qty 2

## 2023-02-06 MED ORDER — ONDANSETRON HCL 4 MG/2ML IJ SOLN: 4.0000 mg | Freq: Once | INTRAMUSCULAR | Status: DC | PRN

## 2023-02-06 MED ORDER — FENTANYL CITRATE PF 50 MCG/ML IJ SOSY: 25.0000 ug | PREFILLED_SYRINGE | INTRAMUSCULAR | Status: DC | PRN

## 2023-02-06 MED ORDER — CLONIDINE HCL (ANALGESIA) 100 MCG/ML EP SOLN
EPIDURAL | Status: DC | PRN
  Administered 2023-02-06: 100 ug

## 2023-02-06 MED ORDER — CEFAZOLIN SODIUM-DEXTROSE 2-4 GM/100ML-% IV SOLN: 2.0000 g | Freq: Four times a day (QID) | INTRAVENOUS | Status: DC

## 2023-02-06 MED ORDER — PROPOFOL 10 MG/ML IV BOLUS
INTRAVENOUS | Status: DC | PRN
  Administered 2023-02-06: 20 mg via INTRAVENOUS
  Administered 2023-02-06: 30 mg via INTRAVENOUS

## 2023-02-06 MED ORDER — SODIUM CHLORIDE (PF) 0.9 % IJ SOLN
INTRAMUSCULAR | Status: AC
  Filled 2023-02-06: qty 10

## 2023-02-06 MED ORDER — BUPIVACAINE LIPOSOME 1.3 % IJ SUSP: 20.0000 mL | Freq: Once | INTRAMUSCULAR | Status: DC

## 2023-02-06 MED ORDER — POVIDONE-IODINE 10 % EX SWAB: 2.0000 | Freq: Once | CUTANEOUS | Status: DC

## 2023-02-06 MED ORDER — 0.9 % SODIUM CHLORIDE (POUR BTL) OPTIME
TOPICAL | Status: DC | PRN
  Administered 2023-02-06: 1000 mL

## 2023-02-06 MED ORDER — CHLORHEXIDINE GLUCONATE 0.12 % MT SOLN
15.0000 mL | Freq: Once | OROMUCOSAL | Status: AC
  Administered 2023-02-06: 15 mL via OROMUCOSAL

## 2023-02-06 MED ORDER — ROPIVACAINE HCL 5 MG/ML IJ SOLN
INTRAMUSCULAR | Status: DC | PRN
  Administered 2023-02-06: 25 mL via PERINEURAL

## 2023-02-06 MED ORDER — CEFAZOLIN SODIUM-DEXTROSE 2-4 GM/100ML-% IV SOLN
2.0000 g | INTRAVENOUS | Status: AC
  Administered 2023-02-06: 2 g via INTRAVENOUS
  Filled 2023-02-06: qty 100

## 2023-02-06 MED ORDER — OXYCODONE HCL 5 MG PO TABS
ORAL_TABLET | ORAL | Status: AC
  Filled 2023-02-06: qty 1

## 2023-02-06 MED ORDER — BUPIVACAINE LIPOSOME 1.3 % IJ SUSP
INTRAMUSCULAR | Status: AC
  Filled 2023-02-06: qty 20

## 2023-02-06 MED ORDER — ORAL CARE MOUTH RINSE: 15.0000 mL | Freq: Once | OROMUCOSAL | Status: AC

## 2023-02-06 MED ORDER — EPHEDRINE SULFATE-NACL 50-0.9 MG/10ML-% IV SOSY
PREFILLED_SYRINGE | INTRAVENOUS | Status: DC | PRN
  Administered 2023-02-06: 10 mg via INTRAVENOUS
  Administered 2023-02-06 (×2): 5 mg via INTRAVENOUS

## 2023-02-06 SURGICAL SUPPLY — 56 items
ADH SKN CLS APL DERMABOND .7 (GAUZE/BANDAGES/DRESSINGS) ×1
ATTUNE MED DOME PAT 32 KNEE (Knees) IMPLANT
ATTUNE PSFEM LTSZ5 NARCEM KNEE (Femur) IMPLANT
ATTUNE PSRP INSR SZ 5 10M KNEE (Insert) IMPLANT
BAG COUNTER SPONGE SURGICOUNT (BAG) IMPLANT
BAG SPEC THK2 15X12 ZIP CLS (MISCELLANEOUS) ×1
BAG SPNG CNTER NS LX DISP (BAG)
BAG ZIPLOCK 12X15 (MISCELLANEOUS) ×1 IMPLANT
BASEPLATE TIBIAL ROTATING SZ 4 (Knees) IMPLANT
BLADE SAG 18X100X1.27 (BLADE) ×1 IMPLANT
BLADE SAW SGTL 11.0X1.19X90.0M (BLADE) ×1 IMPLANT
BNDG CMPR 6 X 5 YARDS HK CLSR (GAUZE/BANDAGES/DRESSINGS) ×1
BNDG CMPR MED 10X6 ELC LF (GAUZE/BANDAGES/DRESSINGS) ×1
BNDG ELASTIC 6INX 5YD STR LF (GAUZE/BANDAGES/DRESSINGS) ×1 IMPLANT
BNDG ELASTIC 6X10 VLCR STRL LF (GAUZE/BANDAGES/DRESSINGS) IMPLANT
BOWL SMART MIX CTS (DISPOSABLE) ×1 IMPLANT
BSPLAT TIB 4 CMNT ROT PLAT STR (Knees) ×1 IMPLANT
CEMENT HV SMART SET (Cement) ×2 IMPLANT
COVER SURGICAL LIGHT HANDLE (MISCELLANEOUS) ×1 IMPLANT
CUFF TOURN SGL QUICK 34 (TOURNIQUET CUFF) ×1
CUFF TRNQT CYL 34X4.125X (TOURNIQUET CUFF) ×1 IMPLANT
DERMABOND ADVANCED .7 DNX12 (GAUZE/BANDAGES/DRESSINGS) ×1 IMPLANT
DRAPE INCISE IOBAN 66X45 STRL (DRAPES) ×1 IMPLANT
DRAPE U-SHAPE 47X51 STRL (DRAPES) ×1 IMPLANT
DRSG AQUACEL AG ADV 3.5X10 (GAUZE/BANDAGES/DRESSINGS) ×1 IMPLANT
DURAPREP 26ML APPLICATOR (WOUND CARE) ×1 IMPLANT
ELECT REM PT RETURN 15FT ADLT (MISCELLANEOUS) ×1 IMPLANT
GLOVE BIO SURGEON STRL SZ 6.5 (GLOVE) IMPLANT
GLOVE BIO SURGEON STRL SZ8 (GLOVE) ×1 IMPLANT
GLOVE BIOGEL PI IND STRL 6.5 (GLOVE) IMPLANT
GLOVE BIOGEL PI IND STRL 7.0 (GLOVE) IMPLANT
GLOVE BIOGEL PI IND STRL 8 (GLOVE) ×1 IMPLANT
GOWN STRL REUS W/ TWL LRG LVL3 (GOWN DISPOSABLE) ×1 IMPLANT
GOWN STRL REUS W/TWL LRG LVL3 (GOWN DISPOSABLE) ×1
HANDPIECE INTERPULSE COAX TIP (DISPOSABLE) ×1
HOLDER FOLEY CATH W/STRAP (MISCELLANEOUS) IMPLANT
IMMOBILIZER KNEE 20 (SOFTGOODS) ×1
IMMOBILIZER KNEE 20 THIGH 36 (SOFTGOODS) ×1 IMPLANT
KIT TURNOVER KIT A (KITS) IMPLANT
MANIFOLD NEPTUNE II (INSTRUMENTS) ×1 IMPLANT
NS IRRIG 1000ML POUR BTL (IV SOLUTION) ×1 IMPLANT
PACK TOTAL KNEE CUSTOM (KITS) ×1 IMPLANT
PADDING CAST COTTON 6X4 STRL (CAST SUPPLIES) ×2 IMPLANT
PIN STEINMAN FIXATION KNEE (PIN) IMPLANT
PROTECTOR NERVE ULNAR (MISCELLANEOUS) ×1 IMPLANT
SET HNDPC FAN SPRY TIP SCT (DISPOSABLE) ×1 IMPLANT
SPIKE FLUID TRANSFER (MISCELLANEOUS) ×1 IMPLANT
SUT MNCRL AB 4-0 PS2 18 (SUTURE) ×1 IMPLANT
SUT STRATAFIX 0 PDS 27 VIOLET (SUTURE) ×1
SUT VIC AB 2-0 CT1 27 (SUTURE) ×3
SUT VIC AB 2-0 CT1 TAPERPNT 27 (SUTURE) ×3 IMPLANT
SUTURE STRATFX 0 PDS 27 VIOLET (SUTURE) ×1 IMPLANT
TRAY FOLEY MTR SLVR 16FR STAT (SET/KITS/TRAYS/PACK) ×1 IMPLANT
TUBE SUCTION HIGH CAP CLEAR NV (SUCTIONS) ×1 IMPLANT
WATER STERILE IRR 1000ML POUR (IV SOLUTION) ×2 IMPLANT
WRAP KNEE MAXI GEL POST OP (GAUZE/BANDAGES/DRESSINGS) ×1 IMPLANT

## 2023-02-06 NOTE — Discharge Instructions (Addendum)
 Frank Aluisio, MD Total Joint Specialist [facility] [address] [place], [place] [address] [phone]  TOTAL KNEE REPLACEMENT POSTOPERATIVE DIRECTIONS    Knee Rehabilitation, Guidelines Following Surgery  Results after knee surgery are often greatly improved when you follow the exercise, range of motion and muscle strengthening exercises prescribed by your doctor. Safety measures are also important to protect the knee from further injury. If any of these exercises cause you to have increased pain or swelling in your knee joint, decrease the amount until you are comfortable again and slowly increase them. If you have problems or questions, call your caregiver or physical therapist for advice.   BLOOD CLOT PREVENTION Take an 81 mg Aspirin two times a day for three weeks following surgery. Then resume one 81 mg Aspirin once a day. You may resume your vitamins/supplements upon discharge from the hospital. Do not take any NSAIDs (Advil, Aleve, Ibuprofen, Meloxicam, etc.) until you are 3 weeks out from surgery  HOME CARE INSTRUCTIONS  Remove items at home which could result in a fall. This includes throw rugs or furniture in walking pathways.  ICE to the affected knee as much as tolerated. Icing helps control swelling. If the swelling is well controlled you will be more comfortable and rehab easier. Continue to use ice on the knee for pain and swelling from surgery. You may notice swelling that will progress down to the foot and ankle. This is normal after surgery. Elevate the leg when you are not up walking on it.    Continue to use the breathing machine which will help keep your temperature down. It is common for your temperature to cycle up and down following surgery, especially at night when you are not up moving around and exerting yourself. The breathing machine keeps your lungs expanded and your temperature down. Do not place pillow under the operative  knee, focus on keeping the knee straight while resting  DIET You may resume your previous home diet once you are discharged from the hospital.  DRESSING / WOUND CARE / SHOWERING Keep your bulky bandage on for 2 days. On the third post-operative day you may remove the Ace bandage and gauze. There is a waterproof adhesive bandage on your skin which will stay in place until your first follow-up appointment. Once you remove this you will not need to place another bandage You may begin showering 3 days following surgery, but do not submerge the incision under water.  ACTIVITY For the first 5 days, the key is rest and control of pain and swelling Do your home exercises twice a day starting on post-operative day 3. On the days you go to physical therapy, just do the home exercises once that day. You should rest, ice and elevate the leg for 50 minutes out of every hour. Get up and walk/stretch for 10 minutes per hour. After 5 days you can increase your activity slowly as tolerated. Walk with your walker as instructed. Use the walker until you are comfortable transitioning to a cane. Walk with the cane in the opposite hand of the operative leg. You may discontinue the cane once you are comfortable and walking steadily. Avoid periods of inactivity such as sitting longer than an hour when not asleep. This helps prevent blood clots.  You may discontinue the knee immobilizer once you are able to perform a straight leg raise while lying down. You may resume a sexual relationship in one month or when given the OK by   your doctor.  You may return to work once you are cleared by your doctor.  Do not drive a car for 6 weeks or until released by your surgeon.  Do not drive while taking narcotics.  TED HOSE STOCKINGS Wear the elastic stockings on both legs for three weeks following surgery during the day. You may remove them at night for sleeping.  WEIGHT BEARING Weight bearing as tolerated with assist device  (walker, cane, etc) as directed, use it as long as suggested by your surgeon or therapist, typically at least 4-6 weeks.  POSTOPERATIVE CONSTIPATION PROTOCOL Constipation - defined medically as fewer than three stools per week and severe constipation as less than one stool per week.  One of the most common issues patients have following surgery is constipation.  Even if you have a regular bowel pattern at home, your normal regimen is likely to be disrupted due to multiple reasons following surgery.  Combination of anesthesia, postoperative narcotics, change in appetite and fluid intake all can affect your bowels.  In order to avoid complications following surgery, here are some recommendations in order to help you during your recovery period.  Colace (docusate) - Pick up an over-the-counter form of Colace or another stool softener and take twice a day as long as you are requiring postoperative pain medications.  Take with a full glass of water daily.  If you experience loose stools or diarrhea, hold the colace until you stool forms back up. If your symptoms do not get better within 1 week or if they get worse, check with your doctor. Dulcolax (bisacodyl) - Pick up over-the-counter and take as directed by the product packaging as needed to assist with the movement of your bowels.  Take with a full glass of water.  Use this product as needed if not relieved by Colace only.  MiraLax (polyethylene glycol) - Pick up over-the-counter to have on hand. MiraLax is a solution that will increase the amount of water in your bowels to assist with bowel movements.  Take as directed and can mix with a glass of water, juice, soda, coffee, or tea. Take if you go more than two days without a movement. Do not use MiraLax more than once per day. Call your doctor if you are still constipated or irregular after using this medication for 7 days in a row.  If you continue to have problems with postoperative constipation, please  contact the office for further assistance and recommendations.  If you experience "the worst abdominal pain ever" or develop nausea or vomiting, please contact the office immediatly for further recommendations for treatment.  ITCHING If you experience itching with your medications, try taking only a single pain pill, or even half a pain pill at a time.  You can also use Benadryl over the counter for itching or also to help with sleep.   MEDICATIONS See your medication summary on the "After Visit Summary" that the nursing staff will review with you prior to discharge.  You may have some home medications which will be placed on hold until you complete the course of blood thinner medication.  It is important for you to complete the blood thinner medication as prescribed by your surgeon.  Continue your approved medications as instructed at time of discharge.  PRECAUTIONS If you experience chest pain or shortness of breath - call 911 immediately for transfer to the hospital emergency department.  If you develop a fever greater that 101 F, purulent drainage from wound, increased redness   or drainage from wound, foul odor from the wound/dressing, or calf pain - CONTACT YOUR SURGEON.                                                   FOLLOW-UP APPOINTMENTS Make sure you keep all of your appointments after your operation with your surgeon and caregivers. You should call the office at the above phone number and make an appointment for approximately two weeks after the date of your surgery or on the date instructed by your surgeon outlined in the "After Visit Summary".  RANGE OF MOTION AND STRENGTHENING EXERCISES  Rehabilitation of the knee is important following a knee injury or an operation. After just a few days of immobilization, the muscles of the thigh which control the knee become weakened and shrink (atrophy). Knee exercises are designed to build up the tone and strength of the thigh muscles and to  improve knee motion. Often times heat used for twenty to thirty minutes before working out will loosen up your tissues and help with improving the range of motion but do not use heat for the first two weeks following surgery. These exercises can be done on a training (exercise) mat, on the floor, on a table or on a bed. Use what ever works the best and is most comfortable for you Knee exercises include:  Leg Lifts - While your knee is still immobilized in a splint or cast, you can do straight leg raises. Lift the leg to 60 degrees, hold for 3 sec, and slowly lower the leg. Repeat 10-20 times 2-3 times daily. Perform this exercise against resistance later as your knee gets better.  Quad and Hamstring Sets - Tighten up the muscle on the front of the thigh (Quad) and hold for 5-10 sec. Repeat this 10-20 times hourly. Hamstring sets are done by pushing the foot backward against an object and holding for 5-10 sec. Repeat as with quad sets.  Leg Slides: Lying on your back, slowly slide your foot toward your buttocks, bending your knee up off the floor (only go as far as is comfortable). Then slowly slide your foot back down until your leg is flat on the floor again. Angel Wings: Lying on your back spread your legs to the side as far apart as you can without causing discomfort.  A rehabilitation program following serious knee injuries can speed recovery and prevent re-injury in the future due to weakened muscles. Contact your doctor or a physical therapist for more information on knee rehabilitation.   POST-OPERATIVE OPIOID TAPER INSTRUCTIONS: It is important to wean off of your opioid medication as soon as possible. If you do not need pain medication after your surgery it is ok to stop day one. Opioids include: Codeine, Hydrocodone(Norco, Vicodin), Oxycodone(Percocet, oxycontin) and hydromorphone amongst others.  Long term and even short term use of opiods can cause: Increased pain  response Dependence Constipation Depression Respiratory depression And more.  Withdrawal symptoms can include Flu like symptoms Nausea, vomiting And more Techniques to manage these symptoms Hydrate well Eat regular healthy meals Stay active Use relaxation techniques(deep breathing, meditating, yoga) Do Not substitute Alcohol to help with tapering If you have been on opioids for less than two weeks and do not have pain than it is ok to stop all together.  Plan to wean off of opioids This   plan should start within one week post op of your joint replacement. Maintain the same interval or time between taking each dose and first decrease the dose.  Cut the total daily intake of opioids by one tablet each day Next start to increase the time between doses. The last dose that should be eliminated is the evening dose.   IF YOU ARE TRANSFERRED TO A SKILLED REHAB FACILITY If the patient is transferred to a skilled rehab facility following release from the hospital, a list of the current medications will be sent to the facility for the patient to continue.  When discharged from the skilled rehab facility, please have the facility set up the patient's Home Health Physical Therapy prior to being released. Also, the skilled facility will be responsible for providing the patient with their medications at time of release from the facility to include their pain medication, the muscle relaxants, and their blood thinner medication. If the patient is still at the rehab facility at time of the two week follow up appointment, the skilled rehab facility will also need to assist the patient in arranging follow up appointment in our office and any transportation needs.  MAKE SURE YOU:  Understand these instructions.  Get help right away if you are not doing well or get worse.   DENTAL ANTIBIOTICS:  In most cases prophylactic antibiotics for Dental procdeures after total joint surgery are not  necessary.  Exceptions are as follows:  1. History of prior total joint infection  2. Severely immunocompromised (Organ Transplant, cancer chemotherapy, Rheumatoid biologic meds such as Humera)  3. Poorly controlled diabetes (A1C &gt; 8.0, blood glucose over 200)  If you have one of these conditions, contact your surgeon for an antibiotic prescription, prior to your dental procedure.    Pick up stool softner and laxative for home use following surgery while on pain medications. Do not submerge incision under water. Please use good hand washing techniques while changing dressing each day. May shower starting three days after surgery. Please use a clean towel to pat the incision dry following showers. Continue to use ice for pain and swelling after surgery. Do not use any lotions or creams on the incision until instructed by your surgeon.  

## 2023-02-06 NOTE — Anesthesia Procedure Notes (Signed)
Anesthesia Regional Block: Adductor canal block   Pre-Anesthetic Checklist: , timeout performed,  Correct Patient, Correct Site, Correct Laterality,  Correct Procedure, Correct Position, site marked,  Risks and benefits discussed,  Surgical consent,  Pre-op evaluation,  At surgeon's request and post-op pain management  Laterality: Lower and Left  Prep: chloraprep       Needles:  Injection technique: Single-shot  Needle Type: Echogenic Needle     Needle Length: 9cm  Needle Gauge: 22     Additional Needles:   Procedures:,,,, ultrasound used (permanent image in chart),,    Narrative:  Start time: 02/06/2023 8:39 AM End time: 02/06/2023 8:45 AM Injection made incrementally with aspirations every 5 mL.  Performed by: Personally  Anesthesiologist: Trevor Iha, MD  Additional Notes: Block assessed prior to surgery. Pt tolerated procedure well.

## 2023-02-06 NOTE — Care Plan (Signed)
Ortho Bundle Case Management Note  Patient Details  Name: Carla Kelley MRN: [id number] Date of Birth: 17-Feb-1951                  L TKA on 02/06/23.  DCP: Home with daughter. DME: No needs. Has RW.  PT: Fay Records 8/9 at 2:00pm   DME Arranged:  N/A DME Agency:       Additional Comments: Please contact me with any questions of if this plan should need to change.    Despina Pole, Case Manager  [facility]  [phone] 02/06/2023, 9:06 AM

## 2023-02-06 NOTE — Transfer of Care (Signed)
Immediate Anesthesia Transfer of Care Note  Patient: Carla Kelley  Procedure(s) Performed: TOTAL KNEE ARTHROPLASTY (Left: Knee)  Patient Location: PACU  Anesthesia Type:Spinal and MAC combined with regional for post-op pain  Level of Consciousness: awake, alert , and oriented  Airway & Oxygen Therapy: Patient Spontanous Breathing and Patient connected to face mask oxygen  Post-op Assessment: Report given to RN and Post -op Vital signs reviewed and stable  Post vital signs: Reviewed and stable  Last Vitals:  Vitals Value Taken Time  BP 104/62 02/06/23 1048  Temp    Pulse 65 02/06/23 1050  Resp 15 02/06/23 1050  SpO2 100 % 02/06/23 1050  Vitals shown include unfiled device data.  Last Pain:  Vitals:   02/06/23 0915  TempSrc:   PainSc: 0-No pain         Complications: No notable events documented.

## 2023-02-06 NOTE — Anesthesia Procedure Notes (Signed)
Spinal  Patient location during procedure: OR Start time: 02/06/2023 9:32 AM End time: 02/06/2023 9:34 AM Reason for block: surgical anesthesia Staffing Performed: resident/CRNA  Resident/CRNA: Pearson Grippe, CRNA Performed by: Pearson Grippe, CRNA Authorized by: Trevor Iha, MD   Preanesthetic Checklist Completed: patient identified, IV checked, site marked, risks and benefits discussed, surgical consent, monitors and equipment checked, pre-op evaluation and timeout performed Spinal Block Patient position: sitting Prep: DuraPrep Patient monitoring: heart rate, cardiac monitor, continuous pulse ox and blood pressure Approach: midline Location: L3-4 Injection technique: single-shot Needle Needle type: Sprotte  Needle gauge: 24 G Needle length: 9 cm Assessment Sensory level: T6 Events: CSF return Additional Notes Consent, allergies, kit expiration verified. Pt in sitting position. Spinal attempts x1, + clear, free-flowing CSF, easy to aspirate/inject, - heme/paresthesia. Level to T6. NAC.

## 2023-02-06 NOTE — Op Note (Signed)
OPERATIVE REPORT-TOTAL KNEE ARTHROPLASTY   Pre-operative diagnosis- Osteoarthritis  Left knee(s)  Post-operative diagnosis- Osteoarthritis Left knee(s)  Procedure-  Left  Total Knee Arthroplasty  Surgeon- Gus Rankin. , MD  Assistant- Arther Abbott, PA-C   Anesthesia-   Adductor canal block and spinal  EBL- 25 ml   Drains None  Tourniquet time-  Total Tourniquet Time Documented: Thigh (Right) - 28 minutes Total: Thigh (Right) - 28 minutes     Complications- None  Condition-PACU - hemodynamically stable.   Brief Clinical Note  Carla Kelley is a 72 y.o. year old female with end stage OA of her left knee with progressively worsening pain and dysfunction. She has constant pain, with activity and at rest and significant functional deficits with difficulties even with ADLs. She has had extensive non-op management including analgesics, injections of cortisone, and home exercise program, but remains in significant pain with significant dysfunction. Radiographs show bone on bone arthritis medial and patellofemoral. She presents now for left Total Knee Arthroplasty.     Procedure in detail---   The patient is brought into the operating room and positioned supine on the operating table. After successful administration of  Adductor canal block and spinal,   a tourniquet is placed high on the  Left thigh(s) and the lower extremity is prepped and draped in the usual sterile fashion. Time out is performed by the operating team and then the  Left lower extremity is wrapped in Esmarch, knee flexed and the tourniquet inflated to 300 mmHg.       A midline incision is made with a ten blade through the subcutaneous tissue to the level of the extensor mechanism. A fresh blade is used to make a medial parapatellar arthrotomy. Soft tissue over the proximal medial tibia is subperiosteally elevated to the joint line with a knife and into the semimembranosus bursa with a Cobb elevator. Soft tissue  over the proximal lateral tibia is elevated with attention being paid to avoiding the patellar tendon on the tibial tubercle. The patella is everted, knee flexed 90 degrees and the ACL and PCL are removed. Findings are bone on bone medial and patellofemoral with large global osteophytes.        The drill is used to create a starting hole in the distal femur and the canal is thoroughly irrigated with sterile saline to remove the fatty contents. The 5 degree Left  valgus alignment guide is placed into the femoral canal and the distal femoral cutting block is pinned to remove 9 mm off the distal femur. Resection is made with an oscillating saw.      The tibia is subluxed forward and the menisci are removed. The extramedullary alignment guide is placed referencing proximally at the medial aspect of the tibial tubercle and distally along the second metatarsal axis and tibial crest. The block is pinned to remove 2mm off the more deficient medial  side. Resection is made with an oscillating saw. Size 4is the most appropriate size for the tibia and the proximal tibia is prepared with the modular drill and keel punch for that size.      The femoral sizing guide is placed and size 5 is most appropriate. Rotation is marked off the epicondylar axis and confirmed by creating a rectangular flexion gap at 90 degrees. The size 5 cutting block is pinned in this rotation and the anterior, posterior and chamfer cuts are made with the oscillating saw. The intercondylar block is then placed and that cut is made.  Trial size 4 tibial component, trial size 5 narrow posterior stabilized femur and a 10  mm posterior stabilized rotating platform insert trial is placed. Full extension is achieved with excellent varus/valgus and anterior/posterior balance throughout full range of motion. The patella is everted and thickness measured to be 21  mm. Free hand resection is taken to 12 mm, a 32 template is placed, lug holes are drilled,  trial patella is placed, and it tracks normally. Osteophytes are removed off the posterior femur with the trial in place. All trials are removed and the cut bone surfaces prepared with pulsatile lavage. Cement is mixed and once ready for implantation, the size 4 tibial implant, size  5 narrow posterior stabilized femoral component, and the size 32 patella are cemented in place and the patella is held with the clamp. The trial insert is placed and the knee held in full extension. The Exparel (20 ml mixed with 60 ml saline) is injected into the extensor mechanism, posterior capsule, medial and lateral gutters and subcutaneous tissues.  All extruded cement is removed and once the cement is hard the permanent 10 mm posterior stabilized rotating platform insert is placed into the tibial tray.      The wound is copiously irrigated with saline solution and the extensor mechanism closed with # 0 Stratofix suture. The tourniquet is released for a total tourniquet time of 28  minutes. Flexion against gravity is 140 degrees and the patella tracks normally. Subcutaneous tissue is closed with 2.0 vicryl and subcuticular with running 4.0 Monocryl. The incision is cleaned and dried and steri-strips and a bulky sterile dressing are applied. The limb is placed into a knee immobilizer and the patient is awakened and transported to recovery in stable condition.      Please note that a surgical assistant was a medical necessity for this procedure in order to perform it in a safe and expeditious manner. Surgical assistant was necessary to retract the ligaments and vital neurovascular structures to prevent injury to them and also necessary for proper positioning of the limb to allow for anatomic placement of the prosthesis.   Gus Rankin , MD    02/06/2023, 10:28 AM

## 2023-02-06 NOTE — Anesthesia Postprocedure Evaluation (Signed)
Anesthesia Post Note  Patient: Carla Kelley  Procedure(s) Performed: TOTAL KNEE ARTHROPLASTY (Left: Knee)     Patient location during evaluation: Nursing Unit Anesthesia Type: Regional and Spinal Level of consciousness: oriented and awake and alert Pain management: pain level controlled Vital Signs Assessment: post-procedure vital signs reviewed and stable Respiratory status: spontaneous breathing, respiratory function stable and patient connected to nasal cannula oxygen Cardiovascular status: blood pressure returned to baseline and stable Postop Assessment: no headache, no backache and no apparent nausea or vomiting Anesthetic complications: no   No notable events documented.  Last Vitals:  Vitals:   02/06/23 1315 02/06/23 1330  BP: 118/70 118/72  Pulse: (!) 57   Resp:    Temp:    SpO2: 97% 97%    Last Pain:  Vitals:   02/06/23 1515  TempSrc:   PainSc: 7                  Trevor Iha

## 2023-02-06 NOTE — Interval H&P Note (Signed)
History and Physical Interval Note:  02/06/2023 7:21 AM  Carla Kelley  has presented today for surgery, with the diagnosis of Left knee osteoarthritis.  The various methods of treatment have been discussed with the patient and family. After consideration of risks, benefits and other options for treatment, the patient has consented to  Procedure(s): TOTAL KNEE ARTHROPLASTY (Left) as a surgical intervention.  The patient's history has been reviewed, patient examined, no change in status, stable for surgery.  I have reviewed the patient's chart and labs.  Questions were answered to the patient's satisfaction.     Homero Fellers 

## 2023-02-06 NOTE — Evaluation (Signed)
Physical Therapy Evaluation Patient Details Name: Carla Kelley MRN: [id number] DOB: 12-Mar-1951 Today's Date: 02/06/2023  History of Present Illness  72 yo female presents to therapy s/p L TKA on 02/06/2023 due to failure of conservative measures. Pt PMH includes but is not limited to: degenerative scoliosis, LLE weakness, R CTS and PVCs.  Clinical Impression      Evalena Clute is a 72 y.o. female POD 0 s/p L TKA. Patient reports IND with mobility at baseline. Patient is now limited by functional impairments (see PT problem list below) and requires CGA and cues for transfers and gait with RW. Patient was able to ambulate 50 and 20 feet with RW and CGA and cues for safe walker management. Patient educated on safe sequencing for stair mobility with RW, fall risk prevention, pain management and goal, use of CP/ICE, importance of slowly progressing with activity, communication with orthopedic per resuming driving  and car transfers pt verbalized understanding of safe guarding position for people assisting with mobility. Patient instructed in exercises to facilitate ROM and circulation reviewed and HO provided. Patient will benefit from continued skilled PT interventions to address impairments and progress towards PLOF. Patient has met mobility goals at adequate level for discharge home with daughters support and OPPT services reportedly starting 8/9; will continue to follow if pt continues acute stay to progress towards Mod I goals.     If plan is discharge home, recommend the following: A little help with walking and/or transfers;A little help with bathing/dressing/bathroom;Assistance with cooking/housework;Assist for transportation;Help with stairs or ramp for entrance   Can travel by private vehicle        Equipment Recommendations None recommended by PT  Recommendations for Other Services       Functional Status Assessment Patient has had a recent decline in their functional status and demonstrates  the ability to make significant improvements in function in a reasonable and predictable amount of time.     Precautions / Restrictions Precautions Precautions: Fall;Knee Restrictions Weight Bearing Restrictions: No      Mobility  Bed Mobility Overal bed mobility: Needs Assistance Bed Mobility: Supine to Sit     Supine to sit: Supervision     General bed mobility comments: min cues    Transfers Overall transfer level: Needs assistance Equipment used: Rolling walker (2 wheels) Transfers: Sit to/from Stand Sit to Stand: Min guard           General transfer comment: min cues for proper UE and AD placement with bed, recliner and commode transfers    Ambulation/Gait Ambulation/Gait assistance: Min guard Gait Distance (Feet): 50 Feet Assistive device: Rolling walker (2 wheels) Gait Pattern/deviations: Step-to pattern, Shuffle Gait velocity: decreased     General Gait Details: cues for safety and sequencing  Stairs Stairs: Yes Stairs assistance: Min guard Stair Management: Two rails Number of Stairs: 3 General stair comments: cues for proper sequecing and technique with instruction provided on use of RW to navigate one step to enter home  Wheelchair Mobility     Tilt Bed    Modified Rankin (Stroke Patients Only)       Balance Overall balance assessment: Needs assistance Sitting-balance support: Feet supported Sitting balance-Leahy Scale: Good     Standing balance support: Bilateral upper extremity supported, During functional activity, Reliant on assistive device for balance Standing balance-Leahy Scale: Fair Standing balance comment: static standing no UE support  Pertinent Vitals/Pain Pain Assessment Pain Assessment: 0-10 Pain Score: 4  Pain Location: L knee Pain Descriptors / Indicators: Constant, Discomfort, Operative site guarding, Throbbing Pain Intervention(s): Limited activity within patient's  tolerance, Repositioned, Premedicated before session, Monitored during session, Ice applied    Home Living Family/patient expects to be discharged to:: Private residence Living Arrangements: Alone Available Help at Discharge: Family (daughter will be with pt for 4 days) Type of Home: House Home Access: Stairs to enter Entrance Stairs-Rails: None Entrance Stairs-Number of Steps: 1   Home Layout: One level Home Equipment: Agricultural consultant (2 wheels);Grab bars - tub/shower;Cane - single point      Prior Function Prior Level of Function : Independent/Modified Independent;Driving             Mobility Comments: mod I with SPC for mobiltiy tasks, IADLs, self care and ADLs       Hand Dominance        Extremity/Trunk Assessment        Lower Extremity Assessment Lower Extremity Assessment: LLE deficits/detail LLE Deficits / Details: ankle DF 4/5 and PT 3+/5 pt indicates LLE weakness at baseline, SLR < 10 degree lag LLE Sensation: WNL    Cervical / Trunk Assessment Cervical / Trunk Assessment:  (wfl)  Communication   Communication: No difficulties  Cognition Arousal/Alertness: Awake/alert Behavior During Therapy: WFL for tasks assessed/performed Overall Cognitive Status: Within Functional Limits for tasks assessed                                          General Comments      Exercises Total Joint Exercises Ankle Circles/Pumps: AROM, 20 reps, Both Quad Sets: AROM, Left, 5 reps Short Arc Quad: AROM, Left, 5 reps Heel Slides: AROM, Left, 5 reps Hip ABduction/ADduction: AROM, Left, 5 reps Straight Leg Raises: AROM, Left, 5 reps Knee Flexion: AROM, Left, 5 reps, Seated   Assessment/Plan    PT Assessment Patient needs continued PT services  PT Problem List Decreased strength;Decreased range of motion;Decreased activity tolerance;Decreased balance;Decreased mobility;Decreased coordination;Pain       PT Treatment Interventions DME instruction;Gait  training;Stair training;Functional mobility training;Therapeutic activities;Therapeutic exercise;Balance training;Neuromuscular re-education;Patient/family education;Modalities    PT Goals (Current goals can be found in the Care Plan section)  Acute Rehab PT Goals Patient Stated Goal: 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, walk the dog and play pickleball PT Goal Formulation: With patient Time For Goal Achievement: 02/20/23 Potential to Achieve Goals: Good    Frequency 7X/week     Co-evaluation               AM-PAC PT "6 Clicks" Mobility  Outcome Measure Help needed turning from your back to your side while in a flat bed without using bedrails?: None Help needed moving from lying on your back to sitting on the side of a flat bed without using bedrails?: A Little Help needed moving to and from a bed to a chair (including a wheelchair)?: A Little Help needed standing up from a chair using your arms (e.g., wheelchair or bedside chair)?: A Little Help needed to walk in hospital room?: A Little Help needed climbing 3-5 steps with a railing? : A Little 6 Click Score: 19    End of Session Equipment Utilized During Treatment: Gait belt Activity Tolerance: Patient tolerated treatment well Patient left: in chair;with call bell/phone within reach Nurse Communication: Mobility status;Other (  comment) (pt readiness for d/c from PT standpoint) PT Visit Diagnosis: Unsteadiness on feet (R26.81);Other abnormalities of gait and mobility (R26.89);Muscle weakness (generalized) (M62.81);Pain;Difficulty in walking, not elsewhere classified (R26.2) Pain - Right/Left: Left Pain - part of body: Knee;Leg    Time: 6045-4098 PT Time Calculation (min) (ACUTE ONLY): 44 min   Charges:   PT Evaluation $PT Eval Low Complexity: 1 Low PT Treatments $Gait Training: 8-22 mins $Therapeutic Exercise: 8-22 mins PT General Charges $$ ACUTE PT VISIT: 1 Visit         Johnny Bridge, PT Acute  Rehab   Jacqualyn Posey 02/06/2023, 2:41 PM

## 2023-02-07 ENCOUNTER — Encounter (HOSPITAL_COMMUNITY): Payer: Self-pay | Admitting: Orthopedic Surgery

## 2023-02-08 ENCOUNTER — Ambulatory Visit: Payer: Medicare HMO | Admitting: Internal Medicine

## 2023-02-10 DIAGNOSIS — M25562 Pain in left knee: Secondary | ICD-10-CM | POA: Diagnosis not present

## 2023-02-13 DIAGNOSIS — M25562 Pain in left knee: Secondary | ICD-10-CM | POA: Diagnosis not present

## 2023-02-13 DIAGNOSIS — M25662 Stiffness of left knee, not elsewhere classified: Secondary | ICD-10-CM | POA: Diagnosis not present

## 2023-02-15 DIAGNOSIS — M6281 Muscle weakness (generalized): Secondary | ICD-10-CM | POA: Diagnosis not present

## 2023-02-15 DIAGNOSIS — M25562 Pain in left knee: Secondary | ICD-10-CM | POA: Diagnosis not present

## 2023-02-17 DIAGNOSIS — M25562 Pain in left knee: Secondary | ICD-10-CM | POA: Diagnosis not present

## 2023-02-17 DIAGNOSIS — M6281 Muscle weakness (generalized): Secondary | ICD-10-CM | POA: Diagnosis not present

## 2023-02-20 DIAGNOSIS — M6281 Muscle weakness (generalized): Secondary | ICD-10-CM | POA: Diagnosis not present

## 2023-02-20 DIAGNOSIS — M25562 Pain in left knee: Secondary | ICD-10-CM | POA: Diagnosis not present

## 2023-02-22 DIAGNOSIS — M6281 Muscle weakness (generalized): Secondary | ICD-10-CM | POA: Diagnosis not present

## 2023-02-22 DIAGNOSIS — M25562 Pain in left knee: Secondary | ICD-10-CM | POA: Diagnosis not present

## 2023-02-24 DIAGNOSIS — M25562 Pain in left knee: Secondary | ICD-10-CM | POA: Diagnosis not present

## 2023-02-24 DIAGNOSIS — M6281 Muscle weakness (generalized): Secondary | ICD-10-CM | POA: Diagnosis not present

## 2023-03-03 DIAGNOSIS — M25562 Pain in left knee: Secondary | ICD-10-CM | POA: Diagnosis not present

## 2023-03-10 DIAGNOSIS — Z5189 Encounter for other specified aftercare: Secondary | ICD-10-CM | POA: Diagnosis not present

## 2023-03-13 DIAGNOSIS — M25562 Pain in left knee: Secondary | ICD-10-CM | POA: Diagnosis not present

## 2023-03-16 DIAGNOSIS — M25562 Pain in left knee: Secondary | ICD-10-CM | POA: Diagnosis not present

## 2023-03-21 DIAGNOSIS — M25562 Pain in left knee: Secondary | ICD-10-CM | POA: Diagnosis not present

## 2023-04-13 ENCOUNTER — Other Ambulatory Visit: Payer: Self-pay | Admitting: Obstetrics and Gynecology

## 2023-04-13 DIAGNOSIS — Z1231 Encounter for screening mammogram for malignant neoplasm of breast: Secondary | ICD-10-CM

## 2023-04-17 DIAGNOSIS — Z5189 Encounter for other specified aftercare: Secondary | ICD-10-CM | POA: Insufficient documentation

## 2023-04-26 DIAGNOSIS — K52831 Collagenous colitis: Secondary | ICD-10-CM | POA: Diagnosis not present

## 2023-04-28 DIAGNOSIS — Z0100 Encounter for examination of eyes and vision without abnormal findings: Secondary | ICD-10-CM | POA: Diagnosis not present

## 2023-05-09 DIAGNOSIS — E119 Type 2 diabetes mellitus without complications: Secondary | ICD-10-CM | POA: Diagnosis not present

## 2023-05-09 DIAGNOSIS — H2513 Age-related nuclear cataract, bilateral: Secondary | ICD-10-CM | POA: Diagnosis not present

## 2023-05-09 DIAGNOSIS — H43813 Vitreous degeneration, bilateral: Secondary | ICD-10-CM | POA: Diagnosis not present

## 2023-05-11 ENCOUNTER — Ambulatory Visit
Admission: RE | Admit: 2023-05-11 | Discharge: 2023-05-11 | Disposition: A | Discharge status: Home / Self Care | Payer: Medicare HMO | Source: Ambulatory Visit | Attending: Obstetrics and Gynecology | Admitting: Obstetrics and Gynecology

## 2023-05-11 DIAGNOSIS — Z1231 Encounter for screening mammogram for malignant neoplasm of breast: Secondary | ICD-10-CM

## 2023-05-16 ENCOUNTER — Other Ambulatory Visit: Payer: Self-pay | Admitting: Obstetrics and Gynecology

## 2023-05-16 DIAGNOSIS — R928 Other abnormal and inconclusive findings on diagnostic imaging of breast: Secondary | ICD-10-CM

## 2023-05-17 DIAGNOSIS — R7303 Prediabetes: Secondary | ICD-10-CM | POA: Diagnosis not present

## 2023-05-17 DIAGNOSIS — E7800 Pure hypercholesterolemia, unspecified: Secondary | ICD-10-CM | POA: Diagnosis not present

## 2023-05-17 DIAGNOSIS — Z23 Encounter for immunization: Secondary | ICD-10-CM | POA: Diagnosis not present

## 2023-05-17 DIAGNOSIS — M810 Age-related osteoporosis without current pathological fracture: Secondary | ICD-10-CM | POA: Diagnosis not present

## 2023-05-17 DIAGNOSIS — I700 Atherosclerosis of aorta: Secondary | ICD-10-CM | POA: Diagnosis not present

## 2023-05-17 DIAGNOSIS — Z0000 Encounter for general adult medical examination without abnormal findings: Secondary | ICD-10-CM | POA: Diagnosis not present

## 2023-05-17 DIAGNOSIS — E119 Type 2 diabetes mellitus without complications: Secondary | ICD-10-CM | POA: Diagnosis not present

## 2023-05-17 DIAGNOSIS — Z79899 Other long term (current) drug therapy: Secondary | ICD-10-CM | POA: Diagnosis not present

## 2023-05-17 DIAGNOSIS — Z9181 History of falling: Secondary | ICD-10-CM | POA: Diagnosis not present

## 2023-05-17 DIAGNOSIS — K52831 Collagenous colitis: Secondary | ICD-10-CM | POA: Diagnosis not present

## 2023-05-24 ENCOUNTER — Ambulatory Visit: Payer: Medicare HMO | Admitting: Podiatry

## 2023-05-25 ENCOUNTER — Ambulatory Visit: Payer: Medicare HMO | Admitting: Podiatry

## 2023-05-25 ENCOUNTER — Encounter: Payer: Self-pay | Admitting: Podiatry

## 2023-05-25 DIAGNOSIS — M2011 Hallux valgus (acquired), right foot: Secondary | ICD-10-CM

## 2023-05-25 DIAGNOSIS — Q666 Other congenital valgus deformities of feet: Secondary | ICD-10-CM

## 2023-05-25 DIAGNOSIS — M19071 Primary osteoarthritis, right ankle and foot: Secondary | ICD-10-CM

## 2023-05-25 NOTE — Progress Notes (Signed)
She presents today states that she is having some numbness in her right ankle and really occurs when the ankle clicks or pops.  She had an MRI done and not 2023 but states that time she never followed up for as she wants to know if this issue could be from that as well.  Objective: Vital signs stable she is alert oriented x 3.  Pulses are palpable.  Still has pain on palpation of the peroneal tendons the Achilles tendon and the posterior tibial tendon and the subtalar joint capsulitis.  She has midfoot collapse with osteoarthritis.  Most likely the culprit is when she is standing on the foot and pressing the plantar nerves across the bones that are arthritic and collapse.  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  Plan: Follow-up with me on an as-needed basis.

## 2023-05-27 ENCOUNTER — Ambulatory Visit
Admission: RE | Admit: 2023-05-27 | Discharge: 2023-05-27 | Disposition: A | Discharge status: Home / Self Care | Payer: Medicare HMO | Source: Ambulatory Visit | Attending: Obstetrics and Gynecology | Admitting: Obstetrics and Gynecology

## 2023-05-27 DIAGNOSIS — N6002 Solitary cyst of left breast: Secondary | ICD-10-CM | POA: Diagnosis not present

## 2023-05-27 DIAGNOSIS — R928 Other abnormal and inconclusive findings on diagnostic imaging of breast: Secondary | ICD-10-CM

## 2023-06-30 DIAGNOSIS — M25551 Pain in right hip: Secondary | ICD-10-CM | POA: Diagnosis not present

## 2023-06-30 DIAGNOSIS — M545 Low back pain, unspecified: Secondary | ICD-10-CM | POA: Diagnosis not present

## 2023-07-13 DIAGNOSIS — M51369 Other intervertebral disc degeneration, lumbar region without mention of lumbar back pain or lower extremity pain: Secondary | ICD-10-CM | POA: Insufficient documentation

## 2023-07-19 DIAGNOSIS — J029 Acute pharyngitis, unspecified: Secondary | ICD-10-CM | POA: Diagnosis not present

## 2023-07-19 DIAGNOSIS — E119 Type 2 diabetes mellitus without complications: Secondary | ICD-10-CM | POA: Diagnosis not present

## 2023-07-19 DIAGNOSIS — K52831 Collagenous colitis: Secondary | ICD-10-CM | POA: Diagnosis not present

## 2023-07-19 DIAGNOSIS — Z20822 Contact with and (suspected) exposure to covid-19: Secondary | ICD-10-CM | POA: Diagnosis not present

## 2023-07-25 ENCOUNTER — Ambulatory Visit: Payer: Medicare HMO | Admitting: Internal Medicine

## 2023-07-27 NOTE — Progress Notes (Signed)
Cardiology Office Note:    Date:  07/27/2023   ID:  Carla Kelley, DOB [birth year], MRN [id number]  PCP:  [name], MD   [facility] Providers Cardiologist:  [name], MD     Referring MD: [name], *   No chief complaint on file. PVCs  History of Present Illness:    Carla Kelley is a 73 y.o. female with no cardiac hx, noted to have frequent PVCs, referral was sent from Carla Kelley. She has outflow tract PVCs. She reports some light headedness. She was prescribed metoprolol 25 mg daily, has not taken, wanted to come here first.  She has a tooth infection. No syncope. No persistent palpitations. She has no cardiac disease. No prior cardiac w/u. Non smoker. She works with Manufacturing systems engineer. No activity limiting CP/SOB. Her grandfather had an MI at 34 y/o. She's from [place].  Interim hx 08/10/2022: Her PCP Dr. [name] recommended considering a stress test. She had high CAC. PCP increased simvastatin to 20 mg daily. She is on a BB. She denies CP or SOB.   Interim hx 01/23/2023  [name] reports occasional skipped heartbeats (PVCs) and is currently managing this with metoprolol, which appears effective. They can climb stairs slowly without chest pain, though knee pain limits their mobility. There is no history of endocarditis or prosthetic valves, and antibiotics are not required before dental work. The patient is deemed fit for upcoming knee surgery without the need for overnight monitoring for PVCs.   Interim hx 07/28/2023 She comes in today for follow-up.  She still can note some skipped beats.  She has some symptoms related to dehydration.  Otherwise no significant shortness of breath or chest pressure.  Current Medications: Current Outpatient Medications on File Prior to Visit  Medication Sig Dispense Refill   acetaminophen (TYLENOL) 500 MG tablet Take 500 mg by mouth daily as needed for moderate pain.     alendronate (FOSAMAX) 70 MG tablet Take 70  mg by mouth every Monday.     Ascorbic Acid (VITAMIN C) 1000 MG tablet Take 1,000 mg by mouth daily.     Calcium Carb-Cholecalciferol (CALCIUM 500 + D PO) Take 1 tablet by mouth daily.     Cholecalciferol (VITAMIN D) 50 MCG (2000 UT) tablet Take 2,000 Units by mouth daily.     fluconazole (DIFLUCAN) 150 MG tablet Take 150 mg by mouth daily. As needed for yeast infections     loratadine (CLARITIN) 10 MG tablet Take 10 mg by mouth daily.     MELATONIN PO Take 2 mg by mouth at bedtime as needed (sleep).     metoprolol succinate (TOPROL XL) 50 MG 24 hr tablet Take 1 tablet (50 mg total) by mouth daily. Take with or immediately following a meal. 90 tablet 3   Multiple Vitamins-Minerals (MULTIVITAMIN PO) Take 1 tablet by mouth daily.     simvastatin (ZOCOR) 20 MG tablet Take 20 mg by mouth every evening.     metFORMIN (GLUCOPHAGE-XR) 500 MG 24 hr tablet Take 500 mg by mouth daily with supper. (Patient not taking: Reported on 07/28/2023)     No current facility-administered medications on file prior to visit.     Allergies:   Sulfa antibiotics   Family History: The patient's family history includes Breast cancer (age of onset: 67) in her maternal grandmother; Melanoma in her mother and sister.  ROS:   Please see the history of present illness.     All other systems reviewed  and are negative.  EKGs/Labs/Other Studies Reviewed:    The following studies were reviewed today: Cardiology Studies CAC scoring 08/03/2022: Coronary calcium score of 381. This was 87th percentile for age-,race-, and sex-matched controls.  Zio 04/26/2022- 11% PVC burden  TTE 04/22/2022-EF normal, rv nl, no valve dx  EKG:  EKG is  ordered today.  The ekg ordered today demonstrates   04/06/2022: NSR, RVOT PVCs  08/10/2022- NSR, no PVCs   EKG Interpretation Date/Time:  Friday July 28 2023 09:54:25 EST Ventricular Rate:  70 PR Interval:  136 QRS Duration:  74 QT Interval:  386 QTC Calculation: 416 R  Axis:   -13  Text Interpretation: Sinus rhythm with occasional Premature ventricular complexes Low voltage QRS Inferior infarct , age undetermined When compared with ECG of 23-Jan-2023 08:56, No significant change was found Confirmed by Carolan Clines ([id number]) on 07/28/2023 9:58:37 AM   Recent Labs: 01/24/2023: BUN 23; Creatinine, Ser 0.80; Hemoglobin 13.6; Platelets 256; Potassium 4.6; Sodium 139   Recent Lipid Panel No results found for: "CHOL", "TRIG", "HDL", "CHOLHDL", "VLDL", "LDLCALC", "LDLDIRECT"   Risk Assessment/Calculations:     Physical Exam:    VS:   Vitals:   07/28/23 0945  BP: 118/62  Pulse: 70     Wt Readings from Last 3 Encounters:  02/06/23 118 lb 12.8 oz (53.9 kg)  01/24/23 118 lb 12.8 oz (53.9 kg)  01/23/23 121 lb 9.6 oz (55.2 kg)     GEN:  Well nourished, well developed in no acute distress HEENT: Normal NECK: No JVD; No carotid bruits LYMPHATICS: No lymphadenopathy CARDIAC: RRR, no murmurs, rubs, gallops RESPIRATORY:  Clear to auscultation without rales, wheezing or rhonchi  ABDOMEN: Soft, non-tender, non-distended MUSCULOSKELETAL:  No edema; No deformity  SKIN: Warm and dry NEUROLOGIC:  Alert and oriented x 3 PSYCHIATRIC:  Normal affect   ASSESSMENT and PLAN   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 Had 11% burden.  Has infrequent PVCs on her EKG. She is asymptomatic -Continue metop 50 mg xL  Elevated CAC: CAC > 300 with > 75th percentile. She is asymptomatic. She has stable ischemic heart disease; Will manage with medications.  Has inferior infarct pattern on EKG - continue her BB per above -continue simvastatin 40 mg (LDL goal <70 mg/dL) per PCP; just changed to 40 a month ago. PCP is planning to reassess; can consider changing to crestor if her LDL is not at goal.  -cont aspirin 81 mg daily  Her dentist wanted to request that she takes antibiotics prior to dental procedures. This is not indicated for her.   Follow up in 6 months with an APP       Medication Adjustments/Labs and Tests Ordered: Current medicines are reviewed at length with the patient today.  Concerns regarding medicines are outlined above.  No orders of the defined types were placed in this encounter.  No orders of the defined types were placed in this encounter.   There are no Patient Instructions on file for this visit.   Signed, [name], MD  07/27/2023 3:35 PM    [facility]

## 2023-07-28 ENCOUNTER — Encounter: Payer: Self-pay | Admitting: Internal Medicine

## 2023-07-28 ENCOUNTER — Ambulatory Visit: Payer: Medicare HMO | Attending: Internal Medicine | Admitting: Internal Medicine

## 2023-07-28 VITALS — BP 118/62 | HR 70 | Ht 60.0 in | Wt 121.2 lb

## 2023-07-28 DIAGNOSIS — I493 Ventricular premature depolarization: Secondary | ICD-10-CM | POA: Diagnosis not present

## 2023-07-28 NOTE — Patient Instructions (Signed)
Medication Instructions:  No changes  *If you need a refill on your cardiac medications before your next appointment, please call your pharmacy*   Lab Work: None at this time  If you have labs (blood work) drawn today and your tests are completely normal, you will receive your results only by: MyChart Message (if you have MyChart) OR A paper copy in the mail If you have any lab test that is abnormal or we need to change your treatment, we will call you to review the results.   Testing/Procedures: None    Follow-Up: At [facility], you and your health needs are our priority.  As part of our continuing mission to provide you with exceptional heart care, we have created designated Provider Care Teams.  These Care Teams include your primary Cardiologist (physician) and Advanced Practice Providers (APPs -  Physician Assistants and Nurse Practitioners) who all work together to provide you with the care you need, when you need it.  Your next appointment:   6 month(s)  Provider:   Any APP  Other Instructions

## 2023-08-03 DIAGNOSIS — E7800 Pure hypercholesterolemia, unspecified: Secondary | ICD-10-CM | POA: Diagnosis not present

## 2023-08-14 DIAGNOSIS — M51369 Other intervertebral disc degeneration, lumbar region without mention of lumbar back pain or lower extremity pain: Secondary | ICD-10-CM | POA: Diagnosis not present

## 2023-08-14 DIAGNOSIS — M5412 Radiculopathy, cervical region: Secondary | ICD-10-CM | POA: Diagnosis not present

## 2023-08-18 DIAGNOSIS — M6281 Muscle weakness (generalized): Secondary | ICD-10-CM | POA: Diagnosis not present

## 2023-08-18 DIAGNOSIS — M545 Low back pain, unspecified: Secondary | ICD-10-CM | POA: Diagnosis not present

## 2023-08-22 DIAGNOSIS — M545 Low back pain, unspecified: Secondary | ICD-10-CM | POA: Diagnosis not present

## 2023-08-22 DIAGNOSIS — M6281 Muscle weakness (generalized): Secondary | ICD-10-CM | POA: Diagnosis not present

## 2023-08-23 DIAGNOSIS — L821 Other seborrheic keratosis: Secondary | ICD-10-CM | POA: Diagnosis not present

## 2023-08-23 DIAGNOSIS — D225 Melanocytic nevi of trunk: Secondary | ICD-10-CM | POA: Diagnosis not present

## 2023-08-23 DIAGNOSIS — L814 Other melanin hyperpigmentation: Secondary | ICD-10-CM | POA: Diagnosis not present

## 2023-08-23 DIAGNOSIS — Z7189 Other specified counseling: Secondary | ICD-10-CM | POA: Diagnosis not present

## 2023-08-30 DIAGNOSIS — M545 Low back pain, unspecified: Secondary | ICD-10-CM | POA: Diagnosis not present

## 2023-08-30 DIAGNOSIS — M6281 Muscle weakness (generalized): Secondary | ICD-10-CM | POA: Diagnosis not present

## 2023-09-01 DIAGNOSIS — M545 Low back pain, unspecified: Secondary | ICD-10-CM | POA: Diagnosis not present

## 2023-09-01 DIAGNOSIS — M6281 Muscle weakness (generalized): Secondary | ICD-10-CM | POA: Diagnosis not present

## 2023-09-05 DIAGNOSIS — N6089 Other benign mammary dysplasias of unspecified breast: Secondary | ICD-10-CM | POA: Diagnosis not present

## 2023-09-06 DIAGNOSIS — M6281 Muscle weakness (generalized): Secondary | ICD-10-CM | POA: Diagnosis not present

## 2023-09-06 DIAGNOSIS — M545 Low back pain, unspecified: Secondary | ICD-10-CM | POA: Diagnosis not present

## 2023-09-08 DIAGNOSIS — M545 Low back pain, unspecified: Secondary | ICD-10-CM | POA: Diagnosis not present

## 2023-09-08 DIAGNOSIS — M6281 Muscle weakness (generalized): Secondary | ICD-10-CM | POA: Diagnosis not present

## 2023-09-12 DIAGNOSIS — N6459 Other signs and symptoms in breast: Secondary | ICD-10-CM | POA: Diagnosis not present

## 2023-09-19 DIAGNOSIS — M545 Low back pain, unspecified: Secondary | ICD-10-CM | POA: Diagnosis not present

## 2023-09-19 DIAGNOSIS — M6281 Muscle weakness (generalized): Secondary | ICD-10-CM | POA: Diagnosis not present

## 2023-09-21 DIAGNOSIS — M545 Low back pain, unspecified: Secondary | ICD-10-CM | POA: Diagnosis not present

## 2023-09-21 DIAGNOSIS — M6281 Muscle weakness (generalized): Secondary | ICD-10-CM | POA: Diagnosis not present

## 2023-09-22 ENCOUNTER — Other Ambulatory Visit: Payer: Self-pay | Admitting: Surgery

## 2023-09-22 DIAGNOSIS — L089 Local infection of the skin and subcutaneous tissue, unspecified: Secondary | ICD-10-CM | POA: Diagnosis not present

## 2023-09-22 DIAGNOSIS — L723 Sebaceous cyst: Secondary | ICD-10-CM | POA: Diagnosis not present

## 2023-09-26 DIAGNOSIS — M545 Low back pain, unspecified: Secondary | ICD-10-CM | POA: Diagnosis not present

## 2023-09-26 DIAGNOSIS — M6281 Muscle weakness (generalized): Secondary | ICD-10-CM | POA: Diagnosis not present

## 2023-09-28 DIAGNOSIS — M6281 Muscle weakness (generalized): Secondary | ICD-10-CM | POA: Diagnosis not present

## 2023-09-28 DIAGNOSIS — M545 Low back pain, unspecified: Secondary | ICD-10-CM | POA: Diagnosis not present

## 2023-10-06 ENCOUNTER — Other Ambulatory Visit: Payer: Self-pay | Admitting: Surgery

## 2023-10-06 DIAGNOSIS — L723 Sebaceous cyst: Secondary | ICD-10-CM | POA: Diagnosis not present

## 2023-10-06 DIAGNOSIS — L089 Local infection of the skin and subcutaneous tissue, unspecified: Secondary | ICD-10-CM | POA: Diagnosis not present

## 2023-10-11 DIAGNOSIS — M545 Low back pain, unspecified: Secondary | ICD-10-CM | POA: Diagnosis not present

## 2023-10-11 DIAGNOSIS — M6281 Muscle weakness (generalized): Secondary | ICD-10-CM | POA: Diagnosis not present

## 2023-10-16 DIAGNOSIS — M6281 Muscle weakness (generalized): Secondary | ICD-10-CM | POA: Diagnosis not present

## 2023-10-16 DIAGNOSIS — M545 Low back pain, unspecified: Secondary | ICD-10-CM | POA: Diagnosis not present

## 2023-10-24 DIAGNOSIS — M6281 Muscle weakness (generalized): Secondary | ICD-10-CM | POA: Diagnosis not present

## 2023-10-24 DIAGNOSIS — M545 Low back pain, unspecified: Secondary | ICD-10-CM | POA: Diagnosis not present

## 2023-11-01 ENCOUNTER — Other Ambulatory Visit: Payer: Self-pay

## 2023-11-01 ENCOUNTER — Encounter (HOSPITAL_BASED_OUTPATIENT_CLINIC_OR_DEPARTMENT_OTHER): Payer: Self-pay | Admitting: Surgery

## 2023-11-01 DIAGNOSIS — M545 Low back pain, unspecified: Secondary | ICD-10-CM | POA: Diagnosis not present

## 2023-11-01 DIAGNOSIS — M6281 Muscle weakness (generalized): Secondary | ICD-10-CM | POA: Diagnosis not present

## 2023-11-03 MED ORDER — CHLORHEXIDINE GLUCONATE CLOTH 2 % EX PADS: 6.0000 | MEDICATED_PAD | Freq: Once | CUTANEOUS | Status: DC

## 2023-11-03 MED ORDER — ENSURE PRE-SURGERY PO LIQD: 296.0000 mL | Freq: Once | ORAL | Status: DC

## 2023-11-03 NOTE — Progress Notes (Signed)

## 2023-11-06 DIAGNOSIS — M6281 Muscle weakness (generalized): Secondary | ICD-10-CM | POA: Diagnosis not present

## 2023-11-06 DIAGNOSIS — M545 Low back pain, unspecified: Secondary | ICD-10-CM | POA: Diagnosis not present

## 2023-11-06 NOTE — H&P (Signed)
 PROVIDER: Debi Fall, MD  MRN: [id number] DOB: 1951-03-10  Subjective   Chief Complaint: Follow-up (Follow up sebacrous cyst of skin/breast )   History of Present Illness: Carla Kelley is a 73 y.o. 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 She has finished her course of antibiotics and reports she is doing well. She has been placing Vaseline to the wound on the medial left breast. She reports minimal discomfort.    Review of Systems: A complete review of systems was obtained from the patient. I have reviewed this information and discussed as appropriate with the patient. See HPI as well for other ROS.  ROS   Medical History: Past Medical History:  Diagnosis Date  Hyperlipidemia  Hypertension   There is no problem list on file for this patient.  Past Surgical History:  Procedure Laterality Date  CESAREAN SECTION  JOINT REPLACEMENT    No Known Allergies  Current Outpatient Medications on File Prior to Visit  Medication Sig Dispense Refill  alendronate (FOSAMAX) 70 MG tablet as directed  ascorbic acid, vitamin C, (VITAMIN C) 1000 MG tablet Take 1,000 mg by mouth once daily  aspirin  81 MG EC tablet Take 81 mg by mouth once daily  azelastine (ASTELIN) 137 mcg nasal spray azelastine 137 mcg (0.1 %) nasal spray aerosol USE 1 SPRAY IN EACH NOSTRIL TWICE A DAY  budesonide (ENTOCORT EC) 3 mg EC capsule Take 9 mg by mouth  calcium citrate (CALCITRATE) 200 mg (950 mg) tablet Take by mouth  denosumab  (PROLIA ) 60 mg/mL inj syringe Inject 60 mg subcutaneously every 6 (six) months  diclofenac (VOLTAREN) 50 MG EC tablet diclofenac sodium 50 mg tablet,delayed release TAKE 1 TABLET BY MOUTH WITH FOOD OR MILK AS NEEDED ONCE A DAY  diclofenac potassium (CATAFLAM) 50 mg tablet Take 50 mg by mouth  fluconazole (DIFLUCAN) 150 MG tablet take 1 tablet by mouth every 72 hours  loratadine (CLARITIN) 10 mg tablet Take 10 mg by mouth  once daily  metoprolol  SUCCinate (TOPROL -XL) 50 MG XL tablet Take 50 mg by mouth once daily  multivitamin with minerals (DAILY MULTIVITAMIN-MINERALS) tablet Take 1 tablet by mouth once daily  simvastatin (ZOCOR) 40 MG tablet take 1 tablet by mouth every day in the evening for 90 days  ZETIA 10 mg tablet 1 tablet Orally Once a day for 90 days   No current facility-administered medications on file prior to visit.   Family History  Problem Relation Age of Onset  Colon cancer Maternal Grandmother  Coronary Artery Disease (Blocked arteries around heart) Maternal Grandfather  Breast cancer Paternal Grandmother    Social History   Tobacco Use  Smoking Status Never  Smokeless Tobacco Never    Social History   Socioeconomic History  Marital status: Divorced  Tobacco Use  Smoking status: Never  Smokeless tobacco: Never  Vaping Use  Vaping status: Never Used  Substance and Sexual Activity  Alcohol use: Never  Drug use: Never   Social Drivers of Health   Received from [employer]  Social Network  Housing Stability: Unknown (09/22/2023)  Housing Stability Vital Sign  Homeless in the Last Year: No   Objective:   Vitals:   Temp: 36.6 C (97.8 F)  PainSc: 0-No pain   There is no height or weight on file to calculate BMI.  Physical Exam   She appears well on exam  The area of the infected sebaceous cyst is doing much better. There  is no obvious infection no purulence. There is still a small open wound on the left medial breast which I treated with a nitrate.  Labs, Imaging and Diagnostic Testing:  Assessment and Plan:   Diagnoses and all orders for this visit:  Infected sebaceous cyst  1.1 x 1 cm  The infected sebaceous cyst of the left breast is now healing well. Complete surgical excision of the cyst is recommended to prevent ongoing infections as well as complete histologic evaluation troller malignancy as this is on the breast. We discussed the reasons for  the surgery. I explained the surgical procedure in detail. We explained the risks which includes bleeding, infection, recurrent cyst formation, the need for further procedures if malignancy is found, etc. She understands and agrees to proceed with surgery

## 2023-11-06 NOTE — Anesthesia Preprocedure Evaluation (Signed)
 Anesthesia Evaluation  Patient identified by MRN, date of birth, ID band Patient awake    Reviewed: Allergy & Precautions, NPO status , Patient's Chart, lab work & pertinent test results, reviewed documented beta blocker date and time   Airway Mallampati: I  TM Distance: >3 FB Neck ROM: Full    Dental no notable dental hx. (+) Teeth Intact, Dental Advisory Given   Pulmonary neg pulmonary ROS   Pulmonary exam normal breath sounds clear to auscultation       Cardiovascular Normal cardiovascular exam+ dysrhythmias (PVCs, on metoprolol )  Rhythm:Regular Rate:Normal     Neuro/Psych negative neurological ROS  negative psych ROS   GI/Hepatic negative GI ROS, Neg liver ROS,,,  Endo/Other  negative endocrine ROS    Renal/GU negative Renal ROS  negative genitourinary   Musculoskeletal  (+) Arthritis , Osteoarthritis,    Abdominal   Peds  Hematology negative hematology ROS (+)   Anesthesia Other Findings   Reproductive/Obstetrics negative OB ROS                             Anesthesia Physical Anesthesia Plan  ASA: 2  Anesthesia Plan: General   Post-op Pain Management: Tylenol  PO (pre-op)*   Induction: Intravenous  PONV Risk Score and Plan: 3 and Ondansetron , Dexamethasone  and Treatment may vary due to age or medical condition  Airway Management Planned: LMA  Additional Equipment: None  Intra-op Plan:   Post-operative Plan: Extubation in OR  Informed Consent: I have reviewed the patients History and Physical, chart, labs and discussed the procedure including the risks, benefits and alternatives for the proposed anesthesia with the patient or authorized representative who has indicated his/her understanding and acceptance.     Dental advisory given  Plan Discussed with: CRNA  Anesthesia Plan Comments:        Anesthesia Quick Evaluation

## 2023-11-07 ENCOUNTER — Encounter (HOSPITAL_BASED_OUTPATIENT_CLINIC_OR_DEPARTMENT_OTHER)
Admission: RE | Disposition: A | Discharge status: Home / Self Care | Payer: Self-pay | Source: Home / Self Care | Attending: Surgery

## 2023-11-07 ENCOUNTER — Ambulatory Visit (HOSPITAL_BASED_OUTPATIENT_CLINIC_OR_DEPARTMENT_OTHER): Payer: Self-pay | Admitting: Anesthesiology

## 2023-11-07 ENCOUNTER — Encounter (HOSPITAL_BASED_OUTPATIENT_CLINIC_OR_DEPARTMENT_OTHER): Payer: Self-pay | Admitting: Surgery

## 2023-11-07 ENCOUNTER — Other Ambulatory Visit: Payer: Self-pay

## 2023-11-07 ENCOUNTER — Ambulatory Visit (HOSPITAL_BASED_OUTPATIENT_CLINIC_OR_DEPARTMENT_OTHER)
Admission: RE | Admit: 2023-11-07 | Discharge: 2023-11-07 | Disposition: A | Discharge status: Home / Self Care | Attending: Surgery | Admitting: Surgery

## 2023-11-07 DIAGNOSIS — I10 Essential (primary) hypertension: Secondary | ICD-10-CM | POA: Diagnosis not present

## 2023-11-07 DIAGNOSIS — Z79899 Other long term (current) drug therapy: Secondary | ICD-10-CM | POA: Insufficient documentation

## 2023-11-07 DIAGNOSIS — N6082 Other benign mammary dysplasias of left breast: Secondary | ICD-10-CM | POA: Diagnosis not present

## 2023-11-07 DIAGNOSIS — Z7982 Long term (current) use of aspirin: Secondary | ICD-10-CM | POA: Insufficient documentation

## 2023-11-07 DIAGNOSIS — I493 Ventricular premature depolarization: Secondary | ICD-10-CM | POA: Insufficient documentation

## 2023-11-07 DIAGNOSIS — N6032 Fibrosclerosis of left breast: Secondary | ICD-10-CM | POA: Insufficient documentation

## 2023-11-07 DIAGNOSIS — M1990 Unspecified osteoarthritis, unspecified site: Secondary | ICD-10-CM | POA: Diagnosis not present

## 2023-11-07 DIAGNOSIS — L723 Sebaceous cyst: Secondary | ICD-10-CM | POA: Insufficient documentation

## 2023-11-07 DIAGNOSIS — E785 Hyperlipidemia, unspecified: Secondary | ICD-10-CM | POA: Diagnosis not present

## 2023-11-07 DIAGNOSIS — L089 Local infection of the skin and subcutaneous tissue, unspecified: Secondary | ICD-10-CM | POA: Diagnosis not present

## 2023-11-07 DIAGNOSIS — Z01818 Encounter for other preprocedural examination: Secondary | ICD-10-CM

## 2023-11-07 DIAGNOSIS — N6002 Solitary cyst of left breast: Secondary | ICD-10-CM | POA: Diagnosis not present

## 2023-11-07 HISTORY — DX: Cardiac arrhythmia, unspecified: I49.9

## 2023-11-07 HISTORY — PX: BREAST CYST EXCISION: SHX579

## 2023-11-07 SURGERY — EXCISION, CYST, BREAST
Anesthesia: General | Site: Breast | Laterality: Left

## 2023-11-07 MED ORDER — PHENYLEPHRINE HCL (PRESSORS) 10 MG/ML IV SOLN
INTRAVENOUS | Status: DC | PRN
  Administered 2023-11-07: 80 ug via INTRAVENOUS

## 2023-11-07 MED ORDER — BUPIVACAINE-EPINEPHRINE 0.5% -1:200000 IJ SOLN
INTRAMUSCULAR | Status: DC | PRN
  Administered 2023-11-07: 10 mL

## 2023-11-07 MED ORDER — PROPOFOL 10 MG/ML IV BOLUS
INTRAVENOUS | Status: DC | PRN
  Administered 2023-11-07: 100 mg via INTRAVENOUS

## 2023-11-07 MED ORDER — ONDANSETRON HCL 4 MG/2ML IJ SOLN: 4.0000 mg | Freq: Once | INTRAMUSCULAR | Status: DC | PRN

## 2023-11-07 MED ORDER — LACTATED RINGERS IV SOLN: INTRAVENOUS | Status: DC

## 2023-11-07 MED ORDER — SODIUM BICARBONATE 4.2 % IV SOLN
INTRAVENOUS | Status: AC
  Filled 2023-11-07: qty 10

## 2023-11-07 MED ORDER — HYDROMORPHONE HCL 1 MG/ML IJ SOLN: 0.2500 mg | INTRAMUSCULAR | Status: DC | PRN

## 2023-11-07 MED ORDER — FENTANYL CITRATE (PF) 100 MCG/2ML IJ SOLN
INTRAMUSCULAR | Status: AC
  Filled 2023-11-07: qty 2

## 2023-11-07 MED ORDER — ARTIFICIAL TEARS OPHTHALMIC OINT
TOPICAL_OINTMENT | OPHTHALMIC | Status: AC
Start: 2023-11-07 — End: ?
  Filled 2023-11-07: qty 17.5

## 2023-11-07 MED ORDER — ACETAMINOPHEN 500 MG PO TABS
ORAL_TABLET | ORAL | Status: AC
  Filled 2023-11-07: qty 2

## 2023-11-07 MED ORDER — OXYCODONE HCL 5 MG PO TABS: 5.0000 mg | ORAL_TABLET | Freq: Once | ORAL | Status: DC | PRN

## 2023-11-07 MED ORDER — EPHEDRINE SULFATE (PRESSORS) 50 MG/ML IJ SOLN
INTRAMUSCULAR | Status: DC | PRN
  Administered 2023-11-07: 10 mg via INTRAVENOUS

## 2023-11-07 MED ORDER — CEFAZOLIN SODIUM-DEXTROSE 2-4 GM/100ML-% IV SOLN
2.0000 g | INTRAVENOUS | Status: AC
  Administered 2023-11-07: 2 g via INTRAVENOUS

## 2023-11-07 MED ORDER — ONDANSETRON HCL 4 MG/2ML IJ SOLN
INTRAMUSCULAR | Status: DC | PRN
  Administered 2023-11-07: 4 mg via INTRAVENOUS

## 2023-11-07 MED ORDER — DEXAMETHASONE SODIUM PHOSPHATE 4 MG/ML IJ SOLN
INTRAMUSCULAR | Status: DC | PRN
  Administered 2023-11-07: 5 mg via INTRAVENOUS

## 2023-11-07 MED ORDER — 0.9 % SODIUM CHLORIDE (POUR BTL) OPTIME
TOPICAL | Status: DC | PRN
Start: 2023-11-07 — End: 2023-11-07
  Administered 2023-11-07: 1000 mL

## 2023-11-07 MED ORDER — CEFAZOLIN SODIUM-DEXTROSE 2-4 GM/100ML-% IV SOLN
INTRAVENOUS | Status: AC
  Filled 2023-11-07: qty 100

## 2023-11-07 MED ORDER — FENTANYL CITRATE (PF) 100 MCG/2ML IJ SOLN
INTRAMUSCULAR | Status: DC | PRN
  Administered 2023-11-07: 50 ug via INTRAVENOUS

## 2023-11-07 MED ORDER — AMISULPRIDE (ANTIEMETIC) 5 MG/2ML IV SOLN: 10.0000 mg | Freq: Once | INTRAVENOUS | Status: DC | PRN

## 2023-11-07 MED ORDER — ACETAMINOPHEN 500 MG PO TABS: 1000.0000 mg | ORAL_TABLET | ORAL | Status: AC

## 2023-11-07 MED ORDER — OXYCODONE HCL 5 MG/5ML PO SOLN: 5.0000 mg | Freq: Once | ORAL | Status: DC | PRN

## 2023-11-07 MED ORDER — LIDOCAINE HCL (CARDIAC) PF 100 MG/5ML IV SOSY
PREFILLED_SYRINGE | INTRAVENOUS | Status: DC | PRN
  Administered 2023-11-07: 60 mg via INTRAVENOUS

## 2023-11-07 MED ORDER — KETOROLAC TROMETHAMINE 30 MG/ML IJ SOLN
INTRAMUSCULAR | Status: DC | PRN
  Administered 2023-11-07: 15 mg via INTRAVENOUS

## 2023-11-07 MED ORDER — ACETAMINOPHEN 500 MG PO TABS
1000.0000 mg | ORAL_TABLET | Freq: Once | ORAL | Status: AC
  Administered 2023-11-07: 1000 mg via ORAL

## 2023-11-07 SURGICAL SUPPLY — 31 items
BLADE SURG 15 STRL LF DISP TIS (BLADE) ×1 IMPLANT
CANISTER SUCT 1200ML W/VALVE (MISCELLANEOUS) IMPLANT
CHLORAPREP W/TINT 26 (MISCELLANEOUS) ×1 IMPLANT
CLIP TI WIDE RED SMALL 6 (CLIP) IMPLANT
COVER BACK TABLE 60X90IN (DRAPES) ×1 IMPLANT
COVER MAYO STAND STRL (DRAPES) ×1 IMPLANT
DERMABOND ADVANCED .7 DNX12 (GAUZE/BANDAGES/DRESSINGS) ×1 IMPLANT
DRAPE LAPAROTOMY 100X72 PEDS (DRAPES) ×1 IMPLANT
DRAPE UTILITY XL STRL (DRAPES) ×1 IMPLANT
ELECTRODE REM PT RTRN 9FT ADLT (ELECTROSURGICAL) ×1 IMPLANT
GAUZE SPONGE 4X4 12PLY STRL LF (GAUZE/BANDAGES/DRESSINGS) ×1 IMPLANT
GLOVE SURG SIGNA 7.5 PF LTX (GLOVE) ×1 IMPLANT
GOWN STRL REUS W/ TWL LRG LVL3 (GOWN DISPOSABLE) ×1 IMPLANT
GOWN STRL REUS W/ TWL XL LVL3 (GOWN DISPOSABLE) ×1 IMPLANT
KIT MARKER MARGIN INK (KITS) ×1 IMPLANT
NDL HYPO 25X1 1.5 SAFETY (NEEDLE) ×1 IMPLANT
NEEDLE HYPO 25X1 1.5 SAFETY (NEEDLE) ×1 IMPLANT
NS IRRIG 1000ML POUR BTL (IV SOLUTION) ×1 IMPLANT
PACK BASIN DAY SURGERY FS (CUSTOM PROCEDURE TRAY) ×1 IMPLANT
PENCIL SMOKE EVACUATOR (MISCELLANEOUS) ×1 IMPLANT
SLEEVE SCD COMPRESS KNEE MED (STOCKING) IMPLANT
SPIKE FLUID TRANSFER (MISCELLANEOUS) IMPLANT
SPONGE T-LAP 4X18 ~~LOC~~+RFID (SPONGE) ×1 IMPLANT
SUT MNCRL AB 4-0 PS2 18 (SUTURE) ×1 IMPLANT
SUT SILK 2 0 SH (SUTURE) ×1 IMPLANT
SUT VIC AB 3-0 SH 27X BRD (SUTURE) ×1 IMPLANT
SYR CONTROL 10ML LL (SYRINGE) ×1 IMPLANT
TOWEL GREEN STERILE FF (TOWEL DISPOSABLE) ×1 IMPLANT
TRAY FAXITRON CT DISP (TRAY / TRAY PROCEDURE) IMPLANT
TUBE CONNECTING 20X1/4 (TUBING) IMPLANT
YANKAUER SUCT BULB TIP NO VENT (SUCTIONS) IMPLANT

## 2023-11-07 NOTE — Transfer of Care (Signed)
 Immediate Anesthesia Transfer of Care Note  Patient: Carla Kelley  Procedure(s) Performed: EXCISION, CYST, BREAST (Left: Breast)  Patient Location: PACU  Anesthesia Type:General  Level of Consciousness: awake, oriented, and patient cooperative  Airway & Oxygen Therapy: Patient Spontanous Breathing and Patient connected to nasal cannula oxygen  Post-op Assessment: Report given to RN and Post -op Vital signs reviewed and stable  Post vital signs: Reviewed and stable  Last Vitals:  Vitals Value Taken Time  BP 127/78 11/07/23 0933  Temp    Pulse 76 11/07/23 0934  Resp 22 11/07/23 0934  SpO2 98 % 11/07/23 0934  Vitals shown include unfiled device data.  Last Pain:  Vitals:   11/07/23 0724  TempSrc: Temporal  PainSc: 0-No pain      Patients Stated Pain Goal: 3 (11/07/23 0724)  Complications: No notable events documented.

## 2023-11-07 NOTE — Discharge Instructions (Addendum)
 You may shower starting tomorrow  Wear what ever makes you the most comfortable  You may use a ice pack, Tylenol , and ibuprofen for pain  No vigorous activity for 1 week   May take Ibuprofen today after 3:15 PM May take Tylenol  today after 1:30 PM   Post Anesthesia Home Care Instructions  Activity: Get plenty of rest for the remainder of the day. A responsible individual must stay with you for 24 hours following the procedure.  For the next 24 hours, DO NOT: -Drive a car -Advertising copywriter -Drink alcoholic beverages -Take any medication unless instructed by your physician -Make any legal decisions or sign important papers.  Meals: Start with liquid foods such as gelatin or soup. Progress to regular foods as tolerated. Avoid greasy, spicy, heavy foods. If nausea and/or vomiting occur, drink only clear liquids until the nausea and/or vomiting subsides. Call your physician if vomiting continues.  Special Instructions/Symptoms: Your throat may feel dry or sore from the anesthesia or the breathing tube placed in your throat during surgery. If this causes discomfort, gargle with warm salt water . The discomfort should disappear within 24 hours.  If you had a scopolamine patch placed behind your ear for the management of post- operative nausea and/or vomiting:  1. The medication in the patch is effective for 72 hours, after which it should be removed.  Wrap patch in a tissue and discard in the trash. Wash hands thoroughly with soap and water . 2. You may remove the patch earlier than 72 hours if you experience unpleasant side effects which may include dry mouth, dizziness or visual disturbances. 3. Avoid touching the patch. Wash your hands with soap and water  after contact with the patch.

## 2023-11-07 NOTE — Anesthesia Procedure Notes (Signed)
 Procedure Name: LMA Insertion Date/Time: 11/07/2023 9:02 AM  Performed by: Lonia Ro, CRNAPre-anesthesia Checklist: Patient identified, Emergency Drugs available, Suction available, Patient being monitored and Timeout performed Patient Re-evaluated:Patient Re-evaluated prior to induction Oxygen Delivery Method: Circle system utilized Preoxygenation: Pre-oxygenation with 100% oxygen Induction Type: IV induction Ventilation: Mask ventilation without difficulty LMA: LMA inserted LMA Size: 3.0 Number of attempts: 1 Placement Confirmation: positive ETCO2 and breath sounds checked- equal and bilateral Tube secured with: Tape Dental Injury: Teeth and Oropharynx as per pre-operative assessment

## 2023-11-07 NOTE — Anesthesia Postprocedure Evaluation (Signed)
 Anesthesia Post Note  Patient: Carla Kelley  Procedure(s) Performed: EXCISION, CYST, BREAST (Left: Breast)     Patient location during evaluation: PACU Anesthesia Type: General Level of consciousness: awake and alert Pain management: pain level controlled Vital Signs Assessment: post-procedure vital signs reviewed and stable Respiratory status: spontaneous breathing, nonlabored ventilation, respiratory function stable and patient connected to nasal cannula oxygen Cardiovascular status: blood pressure returned to baseline and stable Postop Assessment: no apparent nausea or vomiting Anesthetic complications: no  No notable events documented.  Last Vitals:  Vitals:   11/07/23 0945 11/07/23 1003  BP: 124/70 117/68  Pulse: 64 70  Resp: 14   Temp:  36.5 C  SpO2: 97% 96%    Last Pain:  Vitals:   11/07/23 1003  TempSrc: Temporal  PainSc: 0-No pain                 Barbarann Kelly L Yoshimi Sarr

## 2023-11-07 NOTE — Interval H&P Note (Signed)
 History and Physical Interval Note:NO CHANGE IN H AND P  11/07/2023 8:34 AM  Carla Kelley  has presented today for surgery, with the diagnosis of CHRONIC SEBACEOUS CYST LEFT BREAST.  The various methods of treatment have been discussed with the patient and family. After consideration of risks, benefits and other options for treatment, the patient has consented to  Procedure(s) with comments: EXCISION, CYST, BREAST (Left) - LMA EXCISION LEFT BREAST CHRONIC SEBACEOUS CYST as a surgical intervention.  The patient's history has been reviewed, patient examined, no change in status, stable for surgery.  I have reviewed the patient's chart and labs.  Questions were answered to the patient's satisfaction.     Oza Blumenthal

## 2023-11-07 NOTE — Op Note (Signed)
   Carla Kelley 11/07/2023   Pre-op Diagnosis: CHRONIC SEBACEOUS CYST LEFT BREAST     Post-op Diagnosis: SAME  Procedure(s): EXCISION 1 CM CHRONICALLY INFECTED SEBACEOUS CYST LEFT BREAST  Surgeon(s): Oza Blumenthal, MD  Anesthesia: General  Staff:  Circulator: Jil Mosses, RN Relief Circulator: McDonough-Hughes, Jodi C, RN Scrub Person: Fredrick Jenkins, RN  Estimated Blood Loss: Minimal               Specimens: Sent to pathology  Findings: The left breast cyst appeared consistent with a chronically infected sebaceous cyst  Procedure: The patient was brought to the operating identifies the correct patient.  She was placed supine on the operating table.  Anesthesia was then induced.  Her left breast was prepped and draped in usual sterile fashion.  The sebaceous cyst was located in the lower inner quadrant of the left breast near the sternum.  I anesthetized the skin around the previous scar from where the cyst had been infected with Marcaine .  I then made an elliptical incision with a scalpel around the scar and sebaceous cyst.  I then took this down to the breast tissue with electrocautery and excised the cyst and scar tissue in its entirety.  I achieved hemostasis with the cautery.  The cyst was sent to pathology for evaluation.  There was no evidence of purulence or ongoing acute infection.  I closed the subcutaneous tissue with interrupted 3-0 Vicryl sutures and closed the skin with a running 4-0 Monocryl.  Dermabond was then applied.  The patient tolerated the procedure well.  All the counts were correct at the end of the procedure.  She was then extubated in the operating room and taken in a stable condition to the recovery room.          Oza Blumenthal   Date: 11/07/2023  Time: 9:23 AM

## 2023-11-08 ENCOUNTER — Encounter (HOSPITAL_BASED_OUTPATIENT_CLINIC_OR_DEPARTMENT_OTHER): Payer: Self-pay | Admitting: Surgery

## 2023-11-08 LAB — SURGICAL PATHOLOGY

## 2023-11-17 DIAGNOSIS — M545 Low back pain, unspecified: Secondary | ICD-10-CM | POA: Diagnosis not present

## 2023-11-17 DIAGNOSIS — M6281 Muscle weakness (generalized): Secondary | ICD-10-CM | POA: Diagnosis not present

## 2023-11-23 ENCOUNTER — Encounter: Payer: Self-pay | Admitting: Internal Medicine

## 2023-11-23 DIAGNOSIS — M6281 Muscle weakness (generalized): Secondary | ICD-10-CM | POA: Diagnosis not present

## 2023-11-23 DIAGNOSIS — M545 Low back pain, unspecified: Secondary | ICD-10-CM | POA: Diagnosis not present

## 2023-11-29 DIAGNOSIS — M6281 Muscle weakness (generalized): Secondary | ICD-10-CM | POA: Diagnosis not present

## 2023-11-29 DIAGNOSIS — M545 Low back pain, unspecified: Secondary | ICD-10-CM | POA: Diagnosis not present

## 2023-12-08 DIAGNOSIS — R252 Cramp and spasm: Secondary | ICD-10-CM | POA: Diagnosis not present

## 2023-12-12 DIAGNOSIS — M545 Low back pain, unspecified: Secondary | ICD-10-CM | POA: Diagnosis not present

## 2023-12-12 DIAGNOSIS — M6281 Muscle weakness (generalized): Secondary | ICD-10-CM | POA: Diagnosis not present

## 2023-12-14 DIAGNOSIS — Z01419 Encounter for gynecological examination (general) (routine) without abnormal findings: Secondary | ICD-10-CM | POA: Diagnosis not present

## 2023-12-14 DIAGNOSIS — N952 Postmenopausal atrophic vaginitis: Secondary | ICD-10-CM | POA: Diagnosis not present

## 2023-12-14 DIAGNOSIS — M810 Age-related osteoporosis without current pathological fracture: Secondary | ICD-10-CM | POA: Diagnosis not present

## 2023-12-27 ENCOUNTER — Other Ambulatory Visit: Payer: Self-pay

## 2023-12-27 MED ORDER — METOPROLOL SUCCINATE ER 50 MG PO TB24: 50.0000 mg | ORAL_TABLET | Freq: Every day | ORAL | 1 refills | Status: DC

## 2024-01-10 DIAGNOSIS — K52831 Collagenous colitis: Secondary | ICD-10-CM | POA: Diagnosis not present

## 2024-01-22 ENCOUNTER — Telehealth: Payer: Self-pay | Admitting: Physician Assistant

## 2024-01-22 NOTE — Telephone Encounter (Signed)
 Pt is calling to see if she needs antibiotic before having a root canal on Thursday. Please advise

## 2024-01-22 NOTE — Telephone Encounter (Signed)
 Pt advised that according to her last note 07/29/23:  Her dentist wanted to request that she takes antibiotics prior to dental procedures. This is not indicated for her.

## 2024-02-20 DIAGNOSIS — S8002XA Contusion of left knee, initial encounter: Secondary | ICD-10-CM | POA: Diagnosis not present

## 2024-02-20 DIAGNOSIS — Z96652 Presence of left artificial knee joint: Secondary | ICD-10-CM | POA: Diagnosis not present

## 2024-03-05 DIAGNOSIS — M816 Localized osteoporosis [Lequesne]: Secondary | ICD-10-CM | POA: Diagnosis not present

## 2024-03-11 DIAGNOSIS — M5459 Other low back pain: Secondary | ICD-10-CM | POA: Diagnosis not present

## 2024-03-20 DIAGNOSIS — M5451 Vertebrogenic low back pain: Secondary | ICD-10-CM | POA: Diagnosis not present

## 2024-04-05 ENCOUNTER — Other Ambulatory Visit: Payer: Self-pay | Admitting: Family Medicine

## 2024-04-05 DIAGNOSIS — Z1231 Encounter for screening mammogram for malignant neoplasm of breast: Secondary | ICD-10-CM

## 2024-04-14 DIAGNOSIS — R051 Acute cough: Secondary | ICD-10-CM | POA: Diagnosis not present

## 2024-04-14 DIAGNOSIS — R0981 Nasal congestion: Secondary | ICD-10-CM | POA: Diagnosis not present

## 2024-04-14 DIAGNOSIS — H6993 Unspecified Eustachian tube disorder, bilateral: Secondary | ICD-10-CM | POA: Diagnosis not present

## 2024-04-16 DIAGNOSIS — M545 Low back pain, unspecified: Secondary | ICD-10-CM | POA: Diagnosis not present

## 2024-04-18 DIAGNOSIS — M5451 Vertebrogenic low back pain: Secondary | ICD-10-CM | POA: Diagnosis not present

## 2024-04-30 ENCOUNTER — Encounter: Payer: Self-pay | Admitting: Podiatry

## 2024-04-30 ENCOUNTER — Ambulatory Visit: Admitting: Podiatry

## 2024-04-30 ENCOUNTER — Ambulatory Visit (INDEPENDENT_AMBULATORY_CARE_PROVIDER_SITE_OTHER)

## 2024-04-30 DIAGNOSIS — Q666 Other congenital valgus deformities of feet: Secondary | ICD-10-CM

## 2024-04-30 DIAGNOSIS — M19071 Primary osteoarthritis, right ankle and foot: Secondary | ICD-10-CM | POA: Diagnosis not present

## 2024-04-30 NOTE — Progress Notes (Signed)
 He presents today after having not seen her for almost 1 year she is liking the new pair of orthotics if possible.  She is also requesting x-rays today.  She is does not feel that my feet are starting to get worse and these orthotics just are not accommodating it.  Objective: Vital signs are stable alert and oriented x 3.  Pulses are palpable.  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  Right foot is very rigid and quite painful Pad atrophy and what should be the medial longitudinal arch area.  Radiographs taken today demonstrate severe osteoarthritis of the first column of all the way to the level of the talonavicular joint.  She also has midfoot osteoarthritic change across the foot transversely.  Left foot does not demonstrate as severe of arthritic change and joints some appear to be more movable here.  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  Plan: At this point we will going to schedule her with Trish for orthotics.  I do not think that these can be a typical functional type orthotic I think more than likely is going to have to be a cork type orthotic with Plastizote over the top of it.  She is got I have to have something that is softer along that medial longitudinal arch and that does not try to lift her arch.

## 2024-05-02 ENCOUNTER — Other Ambulatory Visit

## 2024-05-03 DIAGNOSIS — M545 Low back pain, unspecified: Secondary | ICD-10-CM | POA: Diagnosis not present

## 2024-05-14 DIAGNOSIS — H43811 Vitreous degeneration, right eye: Secondary | ICD-10-CM | POA: Diagnosis not present

## 2024-05-14 DIAGNOSIS — H5213 Myopia, bilateral: Secondary | ICD-10-CM | POA: Diagnosis not present

## 2024-05-14 DIAGNOSIS — H2513 Age-related nuclear cataract, bilateral: Secondary | ICD-10-CM | POA: Diagnosis not present

## 2024-05-15 DIAGNOSIS — M545 Low back pain, unspecified: Secondary | ICD-10-CM | POA: Diagnosis not present

## 2024-05-16 DIAGNOSIS — K52831 Collagenous colitis: Secondary | ICD-10-CM | POA: Diagnosis not present

## 2024-05-16 DIAGNOSIS — Z23 Encounter for immunization: Secondary | ICD-10-CM | POA: Diagnosis not present

## 2024-05-16 DIAGNOSIS — M810 Age-related osteoporosis without current pathological fracture: Secondary | ICD-10-CM | POA: Diagnosis not present

## 2024-05-16 DIAGNOSIS — M519 Unspecified thoracic, thoracolumbar and lumbosacral intervertebral disc disorder: Secondary | ICD-10-CM | POA: Diagnosis not present

## 2024-05-16 DIAGNOSIS — E7800 Pure hypercholesterolemia, unspecified: Secondary | ICD-10-CM | POA: Diagnosis not present

## 2024-05-16 DIAGNOSIS — Z0000 Encounter for general adult medical examination without abnormal findings: Secondary | ICD-10-CM | POA: Diagnosis not present

## 2024-05-21 ENCOUNTER — Other Ambulatory Visit: Payer: Self-pay | Admitting: Physician Assistant

## 2024-05-23 ENCOUNTER — Ambulatory Visit (INDEPENDENT_AMBULATORY_CARE_PROVIDER_SITE_OTHER)

## 2024-05-23 DIAGNOSIS — M2011 Hallux valgus (acquired), right foot: Secondary | ICD-10-CM | POA: Diagnosis not present

## 2024-05-23 DIAGNOSIS — M2142 Flat foot [pes planus] (acquired), left foot: Secondary | ICD-10-CM | POA: Diagnosis not present

## 2024-05-23 DIAGNOSIS — M2141 Flat foot [pes planus] (acquired), right foot: Secondary | ICD-10-CM | POA: Diagnosis not present

## 2024-05-23 DIAGNOSIS — M19071 Primary osteoarthritis, right ankle and foot: Secondary | ICD-10-CM

## 2024-05-23 NOTE — Progress Notes (Signed)
Release

## 2024-06-03 ENCOUNTER — Ambulatory Visit
Admission: RE | Admit: 2024-06-03 | Discharge: 2024-06-03 | Disposition: A | Source: Ambulatory Visit | Attending: Family Medicine

## 2024-06-03 DIAGNOSIS — Z1231 Encounter for screening mammogram for malignant neoplasm of breast: Secondary | ICD-10-CM

## 2024-06-14 DIAGNOSIS — M545 Low back pain, unspecified: Secondary | ICD-10-CM | POA: Diagnosis not present

## 2024-06-17 DIAGNOSIS — M25511 Pain in right shoulder: Secondary | ICD-10-CM | POA: Diagnosis not present

## 2024-06-17 DIAGNOSIS — M25512 Pain in left shoulder: Secondary | ICD-10-CM | POA: Diagnosis not present

## 2024-06-19 DIAGNOSIS — Z8262 Family history of osteoporosis: Secondary | ICD-10-CM | POA: Diagnosis not present

## 2024-06-19 DIAGNOSIS — M810 Age-related osteoporosis without current pathological fracture: Secondary | ICD-10-CM | POA: Diagnosis not present

## 2024-06-19 DIAGNOSIS — K219 Gastro-esophageal reflux disease without esophagitis: Secondary | ICD-10-CM | POA: Diagnosis not present

## 2024-06-20 ENCOUNTER — Telehealth: Payer: Self-pay

## 2024-06-20 NOTE — Telephone Encounter (Signed)
 Patient's Orthotics are in and balance is $329.00

## 2024-06-21 ENCOUNTER — Encounter: Payer: Self-pay | Admitting: Podiatry

## 2024-06-21 DIAGNOSIS — M810 Age-related osteoporosis without current pathological fracture: Secondary | ICD-10-CM | POA: Diagnosis not present

## 2024-06-25 ENCOUNTER — Encounter: Payer: Self-pay | Admitting: Podiatry

## 2024-07-01 NOTE — Telephone Encounter (Signed)
 Orthotics in office, pt came to pick up

## 2024-07-09 ENCOUNTER — Encounter: Payer: Self-pay | Admitting: Podiatry

## 2024-07-18 ENCOUNTER — Ambulatory Visit: Admitting: Podiatrist

## 2024-07-18 DIAGNOSIS — M2141 Flat foot [pes planus] (acquired), right foot: Secondary | ICD-10-CM

## 2024-07-18 DIAGNOSIS — M2142 Flat foot [pes planus] (acquired), left foot: Secondary | ICD-10-CM

## 2024-07-18 NOTE — Progress Notes (Signed)
 Cork orthotics are not fitting in shoes.  I adjusted to fit in shoes but they are still uncomfortable especially the fifth met lateral and plantar.  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    She will be seen back in 1 month for an appointment Carla Kelley.

## 2024-08-23 ENCOUNTER — Other Ambulatory Visit
